# Patient Record
Sex: Female | Born: 1950 | Race: White | Hispanic: No | Marital: Single | Smoking: Never smoker
Health system: Southern US, Community
[De-identification: ages and names within clinical notes are randomized; demographics above are authoritative.]

## PROBLEM LIST (undated history)

## (undated) DIAGNOSIS — K5190 Ulcerative colitis, unspecified, without complications: Secondary | ICD-10-CM

## (undated) DIAGNOSIS — T4145XA Adverse effect of unspecified anesthetic, initial encounter: Secondary | ICD-10-CM

## (undated) DIAGNOSIS — F330 Major depressive disorder, recurrent, mild: Secondary | ICD-10-CM

## (undated) DIAGNOSIS — R569 Unspecified convulsions: Secondary | ICD-10-CM

## (undated) DIAGNOSIS — F419 Anxiety disorder, unspecified: Secondary | ICD-10-CM

## (undated) DIAGNOSIS — I10 Essential (primary) hypertension: Secondary | ICD-10-CM

## (undated) DIAGNOSIS — F32A Depression, unspecified: Secondary | ICD-10-CM

## (undated) DIAGNOSIS — T8859XA Other complications of anesthesia, initial encounter: Secondary | ICD-10-CM

## (undated) DIAGNOSIS — K219 Gastro-esophageal reflux disease without esophagitis: Secondary | ICD-10-CM

## (undated) DIAGNOSIS — M1990 Unspecified osteoarthritis, unspecified site: Secondary | ICD-10-CM

## (undated) DIAGNOSIS — F329 Major depressive disorder, single episode, unspecified: Secondary | ICD-10-CM

## (undated) HISTORY — PX: COLON SURGERY: SHX602

## (undated) HISTORY — PX: TONSILLECTOMY: SUR1361

## (undated) HISTORY — DX: Unspecified convulsions: R56.9

## (undated) HISTORY — PX: APPENDECTOMY: SHX54

## (undated) HISTORY — DX: Depression, unspecified: F32.A

## (undated) HISTORY — DX: Major depressive disorder, recurrent, mild: F33.0

## (undated) HISTORY — DX: Major depressive disorder, single episode, unspecified: F32.9

## (undated) HISTORY — DX: Anxiety disorder, unspecified: F41.9

## (undated) HISTORY — DX: Essential (primary) hypertension: I10

## (undated) HISTORY — DX: Ulcerative colitis, unspecified, without complications: K51.90

## (undated) HISTORY — PX: OTHER SURGICAL HISTORY: SHX169

---

## 1998-11-30 ENCOUNTER — Encounter: Payer: Self-pay | Admitting: Emergency Medicine

## 1998-11-30 ENCOUNTER — Emergency Department (HOSPITAL_COMMUNITY): Admission: EM | Admit: 1998-11-30 | Discharge: 1998-11-30 | Payer: Self-pay | Admitting: Emergency Medicine

## 1999-10-20 ENCOUNTER — Encounter (INDEPENDENT_AMBULATORY_CARE_PROVIDER_SITE_OTHER): Payer: Self-pay | Admitting: Specialist

## 1999-10-20 ENCOUNTER — Ambulatory Visit (HOSPITAL_COMMUNITY): Admission: RE | Admit: 1999-10-20 | Discharge: 1999-10-20 | Payer: Self-pay | Admitting: Gastroenterology

## 2000-01-08 ENCOUNTER — Emergency Department (HOSPITAL_COMMUNITY): Admission: EM | Admit: 2000-01-08 | Discharge: 2000-01-08 | Payer: Self-pay | Admitting: Emergency Medicine

## 2000-01-18 ENCOUNTER — Encounter: Payer: Self-pay | Admitting: Gastroenterology

## 2000-01-18 ENCOUNTER — Inpatient Hospital Stay (HOSPITAL_COMMUNITY): Admission: EM | Admit: 2000-01-18 | Discharge: 2000-01-25 | Payer: Self-pay | Admitting: Emergency Medicine

## 2000-01-18 ENCOUNTER — Encounter (INDEPENDENT_AMBULATORY_CARE_PROVIDER_SITE_OTHER): Payer: Self-pay | Admitting: *Deleted

## 2000-01-21 ENCOUNTER — Encounter: Payer: Self-pay | Admitting: Gastroenterology

## 2000-02-17 ENCOUNTER — Encounter: Payer: Self-pay | Admitting: Emergency Medicine

## 2000-02-17 ENCOUNTER — Emergency Department (HOSPITAL_COMMUNITY): Admission: EM | Admit: 2000-02-17 | Discharge: 2000-02-17 | Payer: Self-pay | Admitting: Emergency Medicine

## 2000-02-21 ENCOUNTER — Emergency Department (HOSPITAL_COMMUNITY): Admission: EM | Admit: 2000-02-21 | Discharge: 2000-02-21 | Payer: Self-pay | Admitting: Emergency Medicine

## 2000-06-08 ENCOUNTER — Ambulatory Visit (HOSPITAL_COMMUNITY): Admission: RE | Admit: 2000-06-08 | Discharge: 2000-06-08 | Payer: Self-pay | Admitting: Gastroenterology

## 2000-09-20 ENCOUNTER — Inpatient Hospital Stay (HOSPITAL_COMMUNITY): Admission: EM | Admit: 2000-09-20 | Discharge: 2000-09-22 | Payer: Self-pay | Admitting: Emergency Medicine

## 2000-09-20 ENCOUNTER — Encounter: Payer: Self-pay | Admitting: Emergency Medicine

## 2000-09-21 ENCOUNTER — Encounter: Payer: Self-pay | Admitting: Gastroenterology

## 2000-10-05 ENCOUNTER — Encounter: Payer: Self-pay | Admitting: Gastroenterology

## 2000-10-05 ENCOUNTER — Inpatient Hospital Stay (HOSPITAL_COMMUNITY): Admission: EM | Admit: 2000-10-05 | Discharge: 2000-10-09 | Payer: Self-pay | Admitting: Internal Medicine

## 2000-10-09 ENCOUNTER — Encounter: Payer: Self-pay | Admitting: Gastroenterology

## 2000-10-16 ENCOUNTER — Inpatient Hospital Stay (HOSPITAL_COMMUNITY): Admission: EM | Admit: 2000-10-16 | Discharge: 2000-10-18 | Payer: Self-pay | Admitting: Gastroenterology

## 2003-04-29 ENCOUNTER — Ambulatory Visit (HOSPITAL_COMMUNITY): Admission: RE | Admit: 2003-04-29 | Discharge: 2003-04-29 | Payer: Self-pay | Admitting: Gastroenterology

## 2003-04-29 ENCOUNTER — Encounter (INDEPENDENT_AMBULATORY_CARE_PROVIDER_SITE_OTHER): Payer: Self-pay | Admitting: Specialist

## 2005-04-15 ENCOUNTER — Ambulatory Visit (HOSPITAL_COMMUNITY): Admission: RE | Admit: 2005-04-15 | Discharge: 2005-04-16 | Payer: Self-pay | Admitting: Orthopedic Surgery

## 2006-01-06 ENCOUNTER — Ambulatory Visit: Payer: Self-pay | Admitting: Cardiovascular Disease

## 2006-01-06 ENCOUNTER — Inpatient Hospital Stay (HOSPITAL_COMMUNITY): Admission: EM | Admit: 2006-01-06 | Discharge: 2006-01-10 | Payer: Self-pay | Admitting: Emergency Medicine

## 2006-01-09 ENCOUNTER — Encounter: Payer: Self-pay | Admitting: Cardiology

## 2007-04-04 ENCOUNTER — Other Ambulatory Visit: Admission: RE | Admit: 2007-04-04 | Discharge: 2007-04-04 | Payer: Self-pay | Admitting: Obstetrics and Gynecology

## 2011-03-04 NOTE — H&P (Signed)
[facility]  Patient:    Terri [name], Terri [name]                        MRN: [id number] Adm. Date:  [id number] Attending:  Nelda Marseille CC:         Doreatha Lew, M.D.   History and Physical  HISTORY:  Patient, well-known to me with very-difficult-to-control colitis, one of multiple recent admissions, who has just not been doing well since her recent discharge on the 7th, with increasing diarrhea and lower abdominal pain; she has also had periodic nausea and vomiting.  She describes the pain as lower with mostly cramps.  There are some rectal spasms.  She still continues to see a minimal amount of blood in her bowels.  She has had no urinary complaints but has been scared to eat and has continued to lose weight due to not wanting to have her diarrhea.  She has been too weak to get out of the bed and has missed lots of work.  PAST MEDICAL HISTORY:  Her past medical history is pertinent for the colitis, as mentioned above, diagnosed just in the last year.  She has a history of depression in the 1980s and 1990s and the only surgeries are tonsillectomy and leg surgery.  ALLERGIES:  No known allergies.  CURRENT MEDICATIONS:  Asacol, prednisone, 6-MP, folic acid and Wellbutrin as well as iron; we have also had her on Nexium to see if that will help her ______ .  SOCIAL HISTORY:  Social history is pertinent for no street ______ , smoking or drinking and very minimal over-the-counter medicine use.  REVIEW OF SYSTEMS:  She has had some lower back pain, which is new, but no urinary complaints or bleeding and no other skin, eye or joint complaints.  PHYSICAL EXAMINATION  GENERAL:  Patient looks better than I thought, in no acute distress, lying comfortably in bed.  VITAL SIGNS:  Blood pressure 132/65, pulse 119, respirations 22, temperature 98.1.  HEENT:  Sclerae nonicteric.  NECK:  Supple without obvious adenopathy.  LUNGS:  Clear.  HEART:   Regular rate and rhythm.  ABDOMEN:  Soft.  No guarding or rebound.  RECTAL:  Exam not done.  BACK:  She does have some mild back pain on palpation.  No spinal process tenderness.  EXTREMITIES:  Good peripheral pulses.  No pedal edema.  LABORATORY AND X-RAY FINDINGS:  Labs and x-rays were reviewed, pertinent for a white count of 3.4 with a hemoglobin of 8.3, hematocrit 25.2 and platelet count 382,000.  Chemistries surprisingly normal except for an albumin of 3.3.  X-rays without obvious acute abnormality.  ASSESSMENT 1. Colitis flare. 2. Anemia secondary to gastrointestinal blood loss.  PLAN:  Bowel rest.  IV steroids.  I have discussed with her and her mom a university consult versus surgical options and we will try to set her up as soon as we can as an outpatient with Dr. Rona Ravens.  We have discussed transfusion, although her hematocrit is still just a touch over 25; I bet with increased hydration, she would drop below that level, so I felt it was in her best interest to go ahead and transfuse her now, which she agrees with.  She might need repeat colonoscopy to reevaluate her colon, versus barium enema to evaluate the stricture in the proximal transverse.  Possibly, we will put her on a short course of TPN for bowel rest or consider stopping the 6-MP  and the Asacol and just going with prednisone, to see if they are playing a role with her diarrhea. DD:  10/05/00 TD:  10/06/00 Job: [id number]

## 2011-03-04 NOTE — Op Note (Signed)
NAME:  Terri [name], Terri [name]                           ACCOUNT NO.:  [id number]   MEDICAL RECORD NO.:  [id number]                   PATIENT TYPE:  AMB   LOCATION:  ENDO                                 FACILITY:  [facility]   PHYSICIAN:  Petra Kuba, M.D.                 DATE OF BIRTH:  Mar 11, 1951   DATE OF PROCEDURE:  04/29/2003  DATE OF DISCHARGE:                                 OPERATIVE REPORT   PROCEDURE:  Flexible sigmoidoscopy with biopsy.   INDICATIONS:  Diarrhea, history of ulcerative colitis, status post surgery.  Consent was signed after risks, benefits, methods, and options were  thoroughly discussed in the office on multiple occasions.   PREMEDICATIONS:  Demerol 50 mg, Versed 4 mg.   DESCRIPTION OF PROCEDURE:  Rectal inspection pertinent for small external  hemorrhoids.  Digital exam is pertinent for what seemed to be a hemorrhoidal  or anal stricture but no mass was felt.  Pediatric video adjustable  colonoscope was inserted.  Anorectal pull-through confirmed some small  hemorrhoids.  Did not reveal any obvious mass, polyp or obvious stricturing  lesion in any rectal tissue present.  There was no obvious proctitis but  there seemed to be small bowel mucosa which was normal almost as soon as we  were past the anorectal junction.  Shortly above that area, about 10 cm, was  a double-barreled anastomosis implying a small bowel side to rectal end  anastomosis.  One limb was very shortened and blind with no abnormalities of  that limb.  The other limb we were able to advance to about 30-35 cm but  there was tortuosity and looping and we elected not to push any harder.  Scattered random biopsies of both this area and the distal anastomosis were  obtained.  We elected not to retroflex when no abnormalities were seen. The  scope was removed.  Air was suctioned.  The patient tolerated the procedure  well.  There was no obvious immediate complication.   ENDOSCOPIC DIAGNOSES:  1. Small  internal and external hemorrhoids with small palpable possible     stricture but not seen with the scope.  2. Ileoanal anastomosis with an ileo side anastomosis.  3. Otherwise exam normal to the short limb end as well as to about 35 cm     felt secondary to tortuosity and looping, status post random biopsies     throughout.   PLAN:  1. Await pathology.  2. Consider Carafate or Questran.  3.     Consider a small bowel follow-through to rule out other etiology.  4. Will touch base with her when we review the biopsies and decide how to     proceed.  5. We asked her to follow up in one month to review how medicine options are     going and have her call me p.r.n.  Petra Kuba, M.D.    MEM/MEDQ  D:  04/29/2003  T:  04/29/2003  Job:  [id number]   cc:   Duwayne Heck L. Mahaffey, M.D.  [address]  [place]  [place] [address]  Fax: [phone]

## 2011-03-04 NOTE — Discharge Summary (Signed)
[facility]  Patient:    Terri [name], Terri [name]                        MRN: [id number] Adm. Date:  [id number] Disc. Date: [id number] Attending:  Nelda Marseille CC:         Doreatha Lew, M.D.   Discharge Summary  HISTORY:  One of many admissions for this patient with inflammatory bowel disease not responding to the usual medicines who has been doing poorly at home with increased diarrhea, weakness, fatigue since her last admission.  She came in today for a flexible sigmoidoscopy and colonoscopy to reevaluate her disease.  HOSPITAL COURSE: She was again found to be significantly anemic requiring transfusion.  Her flexible sigmoidoscopy was down to 40 cm showing significant pyelitis throughout and she was admitted for a bowel rest, IV steroids, and transfusion.  She had an appointment at the [facility] clinic the end of January but we were able to get her in on the 2nd urgently to be evaluated. She did well in the 48-hour hospitalization with bowel rest and after discussion with her and her mother, she was discharged and her mother was to drive her to [facility] for further work-up and plans.  DISCHARGE DIAGNOSES: 1. Inflammatory bowel disease with flare. 2. Symptomatic anemia secondary to gastrointestinal bleed secondary to #1.  CONDITION ON DISCHARGE:  Stable.  DISCHARGE INSTRUCTIONS:  Go to [facility] to their clinic to be admitted with further workup plans, possible experimental protocol cyclosporine, etc., versus possible even surgical consultation. DD:  10/24/00 TD:  10/24/00 Job: [id number]

## 2011-03-04 NOTE — Discharge Summary (Signed)
[facility]  Patient:    Terri [name], Terri [name]                        MRN: [id number] Adm. Date:  [id number] Disc. Date: [id number] Attending:  Devoria Albe CC:         Petra Kuba, M.D.             Doreatha Lew, M.D.                           Discharge Summary  HOSPITAL COURSE:  Patient was admitted on April 3 with inflammatory bowel disease not responding to the usual medicines, with increased nausea, vomiting, dehydration, abdominal pain, and also with increasing anemia.  We went ahead and transfused her and put her on high-dose IV steroids.  There was a fair amount of depression and psychiatric overlay, and Dr. Adonis Housekeeper assisted with seeing the patient and did start her on an antidepressant which she believed did help.  We used Wellbutrin 150 b.i.d.  She continued to have symptoms despite the high-dose steroids, and we went ahead and even increased them and proceeded with a repeat colonoscopy on April 5 which did document significant disease.  There was no obvious problem but we went ahead based on its severity and started some TPN since she was still not able to advance her diet.  However, over the weekend, on TPN and on an increased dose of Solu-Medrol, she seemed to be doing better and was deemed ready for outpatient management on April 10.  CONDITION ON DISCHARGE:  Improved.  DISCHARGE DIAGNOSES: 1. Inflammatory bowel disease. 2. Depression. 3. Anemia.  DISCHARGE MEDICATIONS: 1. Prednisone 40 mg two times a day. 2. Tylenol okay and only over-the-counter. 3. Ultram one to two every 6 to 8 as needed. 4. Okay to use Librax for cramps, sleeping at night. 5. Okay to try the Rowasa enemas she has at home to see if they are helpful.  ACTIVITIES:  Very slowly increase as tolerated.  No work for one week.  DIET:  Low residue.  We did have the dietician go over that with her.  No fried, spicy, nuts, or popcorn.  WOUND CARE:  Not  applicable.  SPECIAL INSTRUCTIONS:  Call sooner if increased pain, bleeding, fever, weakness, nausea, vomiting, or other questions or problems.  FOLLOW-UP:  Follow-up will be on Wednesday, April 18, call sooner if needed to be seen.  ADDENDUM:  She, by the way, elected not to start her Wellbutrin just to see how she would do without it but will have a low threshold for restarting it in the future. DD:  02/17/00 TD:  02/19/00 Job: [id number]

## 2011-03-04 NOTE — Discharge Summary (Signed)
[facility]  Patient:    Terri [name], Terri [name]                        MRN: [id number] Adm. Date:  [id number] Disc. Date: [id number] Attending:  Nelda Marseille                           Discharge Summary  HISTORY:  The patient was admitted with colitis out of control and a flare with anemia and blood loss and increased fatigue.  Was put on bowel rest and IV steroids.  We tried to get her a consultation with Dr. Cedric Fishman of [facility], but with the holidays, they were too booked up unless patient needed inpatient transfer, and we deemed her condition too stable for a holiday transfer but would have a low threshold for transferring her in the future. We did go ahead and transfuse her for a hemoglobin of 8, knowing that with hydration it would drop even further, so this helped her symptoms.  She did well with bowel rest with less pain, diarrhea, less cough, and her pain decreased.  The transfusion definitely helped.  On the 22nd, we were able to slowly advance her diet, and unfortunately her diarrhea seemed to pick up on the 23rd, so she was kept one more day, and I saw her on the 24th.  All of her labs were stable, and she was doing much better, and we elected to proceed with an upper GI small-bowel follow-through just to make sure we were not missing anything.  Because of the holiday, although she was not perfect, she was much improved.  We did decide that she would be able to go home for the holidays after the x-ray if it was okay, which it was, but we would probably check a colonoscopy in one week, and she and her mother were both aware that although she was not cured, she might need to be readmitted in the near future.  It was probably best to try outpatient management one more time.  CONDITION ON DISCHARGE:  Improved.  DISCHARGE MEDICATIONS:  Continue prednisone, iron, 6-MP, Wellbutrin, and Librax.  We will go ahead and stop the Asacol to make sure it is  not playing a role with her diarrhea.  If no better on Friday, she was instructed to stop the Wellbutrin again to make sure it was not playing a role.  ACTIVITY:  Slowly advance as tolerated.  DIET:  Continued low residue.  WOUND CARE:  Not applicable.  SPECIAL INSTRUCTIONS:  Call sooner if questions, problems, increased diarrhea, fatigue, bleeding, pain, etc., and she will call me Thursday with an update and if no better and diarrhea continuing, we will go ahead and schedule her for the outpatient colonoscopy on Monday.  FOLLOW-UP:  As above.  I will leave her out of work until after the colonoscopy which would be October 18, 2000. DD:  10/28/00 TD:  10/29/00 Job: [id number]

## 2011-03-04 NOTE — Op Note (Signed)
NAME:  Terri [name], Terri [name]                 ACCOUNT NO.:  [id number]   MEDICAL RECORD NO.:  [id number]          PATIENT TYPE:  OIB   LOCATION:  5036                         FACILITY:  [facility]   PHYSICIAN:  Doralee Albino. Carola Frost, M.D. DATE OF BIRTH:  1951-04-03   DATE OF PROCEDURE:  04/15/2005  DATE OF DISCHARGE:                                 OPERATIVE REPORT   PREOPERATIVE DIAGNOSIS:  Symptomatic hardware, right tibia.   POSTOPERATIVE DIAGNOSIS:  Symptomatic hardware, right tibia.   PROCEDURE:  Removal of implant deep right tibia.   SURGEON:  Doralee Albino. Carola Frost, M.D.   ASSISTANT:  Cecil Cranker, P.A.-C.   ANESTHESIA:  General.   COMPLICATIONS:  None.   SPECIMENS:  Two, anaerobic and aerobic cultures sent from the intramedullary  canal following their removal.   DISPOSITION:  To the PACU.   CONDITION:  Stable.   INDICATIONS FOR PROCEDURE:  Terri [name] is a 60 year old female who was  complaining of anterior tibial discomfort and tenderness over her locking  bolt sites proximally and distally over the last several months which had  failed to resolve with nonoperative management and a steroid injection  around the proximal knee by one of my partners.  After discussing the risks  and benefits of surgery, she wished to proceed.  She understood the risks of  failure of this removal to improve her symptoms.  She understood that if  there was any evidence of infection, she would need to undergo reaming of  her canal and also the possibility of fracture, blood loss, and infection.   DESCRIPTION OF PROCEDURE:  Because there was no suspicion of infection based  on her clinical examination and negative laboratory studies, Terri [name] was  administered preoperative antibiotics and then taken to the operating room  where general anesthesia was induced and she was placed supine on the  fracture table.  A standard prep and drape was performed of the right lower  extremity.  The old locking bolt  incisions were remade and the locking bolts  backed out in their entirety except for one of the distal locks which was  left partially in the nail to prevent rotation during capture at the  proximal end of the nail, itself.  The locking bolts were noted to be very  loose on removal.  The large incision over the anterior knee was then  partially reopened and dissection was carried down to the paratenon which  was split longitudinally for repair following the procedure.  We then  continued dissection distally where the tibial nail was noted to have  actually been placed not through the knee but medial to the tubercle and the  proximal 1.5 cm were actually protruded into the bone.  There was cortex on  the posterior side of the nail along its medial margin and it had partially  grown over and was anterior on its lateral margin adjacent to the tubercle.  The end was debrided of soft tissue with curet and rongeur and then captured  with the universal removing bolt from [employer] as this was a [employer] nail.  The locking bolt was then removed in its entirety and the nail  removed without complications.  There was no significant fluid collection  encountered either at the locking bolt sites or at the apex of the nail.  There was no purulence of any kind.  The canal was then swabbed with aerobic  and anaerobic cultures which were sent to pathology.  No reaming was  performed because there was no evidence of infection.  The wounds were  copiously irrigated and then closed in standard layered fashion with 0  Vicryl for the paratenon and deep layer, 2-0 for the subcu, and staples for  the skin.  Sterile, gently compressive dressing was applied.  The patient  was awakened from anesthesia and transported to the PACU in stable  condition.   PROGNOSIS:  Terri [name] will be weight-bearing as tolerated with crutches for  assistance and I am hopeful that she will be able to regain full ambulatory   function over the next month or so.  She has a good chance of resolving her  symptoms, or at least dramatically improving them, given that this nail  appeared to have been inserted through the medial tubercle area and that it  was prominent, it was loose, and it was stainless steel.  We will follow up  her cultures and if she develops any signs or evidence of infection, then we  will alter her treatment accordingly.  She will be on Lovenox while in the  hospital for DVT prophylaxis and then will take a baby aspirin daily until  she has regained full mobility.       MHH/MEDQ  D:  04/15/2005  T:  04/15/2005  Job:  [id number]

## 2011-03-04 NOTE — H&P (Signed)
[facility]  Patient:    Terri [name], Terri [name]                        MRN: [id number] Proc. Date: 01/18/00 Adm. Date:  [id number] Disc. Date: [id number] Attending:  Otilio Connors Iv CC:         Doreatha Lew, M.D.                         History and Physical  INDICATIONS:  The patient with recently diagnosed inflammatory bowel disease, not responding to outpatient management with frequent bloody diarrhea.  This weekend began having increased nausea and vomiting and lower abdominal pain.  She says he moves her bowels everytime she moves.  She has not been urinating much at all, going rarely once a week.  She has had multiple crying spells.  She had been to the emergency room once already and has had close office follow-up, but says she has been taking her medicine properly, but has not responded and is being admitted or further workup, plans, and IV medicines, and bowel rest.  She has had no sick contacts and has no other specific complaints.  PAST MEDICAL HISTORY:  Pertinent for leg surgery and tonsillectomy, but no chronic medical problems.  She is going through menopause.  MEDICATIONS: 1. Asacol. 2. Prednisone. 3. She has been on Librax which has not been helpful.  ALLERGIES:  No known drug allergies.  SOCIAL HISTORY:  She does not smoke or drink.  She does not use any aspirin or nonsteroidals.  FAMILY HISTORY:  Negative for an Crohns or colitis in the family.  PHYSICAL EXAMINATION:  VITAL SIGNS:  Temperature 99.9, pulse 130, sclerae nonicteric.  NECK:  Supple without adenopathy.  LUNGS:  Clear.  HEART:  Regular rate and rhythm.  ABDOMEN:  Soft, essentially nontender.  No guarding or rebound.  EXTREMITIES:  She is crying.  No pedal edema, good peripheral pulses.  LABORATORY DATA:  Pending at the time of dictation.  X-rays pertinent for some decreased haustral markings in the colon.  ASSESSMENT: 1. Inflammatory bowel  disease. 2. Nausea and vomiting and dehydration and abdominal pain. 3. Anemia.  PLAN:  Await labs, IV fluids, bowel rest, will transfuse p.r.n.  She does have  problem with that.  Will use IV steroids.  Follow clinical course and decide any further workup and plans.  Might need to try Remicaid, possibly may even need an antidepressant or psychological help.  May even need Doreatha Lew, M.D. to  visit to assist in that department. DD:  01/18/00 TD:  01/18/00 Job: [id number]

## 2011-03-04 NOTE — Discharge Summary (Signed)
Terri [name], [name]                 ACCOUNT NO.:  [id number]   MEDICAL RECORD NO.:  [id number]          PATIENT TYPE:  INP   LOCATION:  4731                         FACILITY:  [facility]   PHYSICIAN:  Kela Millin, M.D.DATE OF BIRTH:  [birth year]   DATE OF ADMISSION:  01/06/2006  DATE OF DISCHARGE:  01/10/2006                                 DISCHARGE SUMMARY   DISCHARGE DIAGNOSES:  1. Chest pain -- noncardiac per Cardiology, likely secondary to      gastroesophageal reflux disease.  2. Presyncope -- unclear etiology, patient to follow up with [facility]      Cardiology for loop monitor.  3. History of ulcerative colitis -- status post colectomy.   PROCEDURES AND STUDIES:  1. Stress Myoview -- per [facility], negative for ischemia.  2. Two-dimensional echocardiogram -- overall left ventricular systolic      function normal, ejection fraction 60% to 65%, no diagnostic evidence      of left ventricular regional wall motion abnormality.   CONSULTATIONS:  [facility].   HISTORY:  The patient is a 60 year old female who presented with complaints  of chest pain.  She stated that she was awakened from sleep by severe  midsternal chest discomfort.  She had been referred to Dr. Ewing Schlein about this  problem and given samples of medication that the patient could not remember  the name of and she was instructed to take 1 tablet daily, but the patient  had not been taking it consistently.  She reported that the medication had  been very helpful and she thought it probably was for acid reflux.  She  described the chest pain as midsternal, not associated with shortness of  breath nor diaphoresis.  She started that at about 1:30 p.m. on the day of  admission, she got out of her car to go back to work and said she felt as if  things were closing in on her and if she were going to pass out.  She stated  that she also felt like her heart was beating fast.  When she came to the  door where  she works, her coworkers recognized that she was not feeling well  and sat in a chair.  She did not lose consciousness.  She did not experience  any chest pain at the time.  EMS was called and her blood pressure was  checked at that time and was 150/98 with a pulse of 96 and the patient was  transported to the [facility] ER.  On arrival in the ER, her blood pressure was  143/79, O2 SAT of 96%.  She was given a sublingual nitroglycerin, which gave  her a headache, but really did not seem to have any other effect.  She had a  chest x-ray done which was negative and her point-of-care markers were  negative.  It was noted that her father had died at age 7 of a myocardial  infarction.  She was admitted to the [facility] for further  evaluation and management.   PHYSICAL EXAMINATION:  Her physical exam upon admission  as per Dr. Tresa Endo  revealed a temperature of 97.8, blood pressure 140/79, pulse of 86,  respiratory rate of 20, O2 SAT of 96% and the rest of her physical exam was  noted to be within normal limits.   LABORATORY DATA:  Her cardiac enzymes were negative.  Her sodium was 138,  potassium 3.7, chloride 108, glucose 86, BUN 8.  Her white cell count 6.6,  hemoglobin 16, hematocrit of 47, platelet count 178,000.  Her hemoglobin A1c  5.1 and total cholesterol 169.   HOSPITAL COURSE:  PROBLEM #1 - CHEST PAIN/PRESYNCOPE:  Upon admission, the  patient had serial cardiac enzymes done and those were negative for MI.  Cardiology was consulted and [facility] saw the patient and a stress test was  done and it was negative for ischemia.  A 2-D echo was also done and it as  within normal limits, as noted above.  The impression was that this chest  pain was likely secondary to GERD and the patient was placed on a PPI.  She  was discharged home on Prilosec and to follow up with Dr. Ewing Schlein,  gastroenterologist.  The patient was seen by Cardiology on rounds and agreed  with discharging the  patient.  She was to follow up with [facility]  for a loop monitor for further evaluation of the presyncopal episode.   DISCHARGE MEDICATIONS:  Prilosec.   FURTHER CARE:  1. Dr. Ewing Schlein, as scheduled.  2. [facility], as scheduled.      Kela Millin, M.D.  Electronically Signed     ACV/MEDQ  D:  05/04/2006  T:  05/04/2006  Job:  [id number]   cc:   Leanne Chang, M.D.

## 2011-03-04 NOTE — H&P (Signed)
[facility]  Patient:    Terri [name], Terri [name]                          MRN: [id number] Adm. Date:  09/20/00 Attending:  Verlin Grills, M.D. CC:         Petra Kuba, M.D.   History and Physical  PROBLEM:  Ulcerative colitis and anemia.  HISTORY:  The patient is a 60 year old female.  She was diagnosed with ulcerative colitis by colonoscopy performed by Dr. Petra Kuba in November 2000.  Despite prednisone, 6-mercaptopurine and Asacol, the patient continues to have abdominal cramps, bloody diarrhea and intermittent night sweats.  She has received intravenous Remicade in the past.  She has required blood transfusions in the past few months.  She has lost approximately 50 pounds since her ulcerative colitis diagnosis in November of 2000.  MEDICATION ALLERGIES:  None.  CHRONIC MEDICATIONS:  Prednisone, 6-mercaptopurine, folic acid, Wellbutrin, iron and Asacol.  PAST MEDICAL HISTORY:  Surgery for right leg fracture, tonsillectomy, chronic ulcerative colitis.  HABITS:  Ms. Holts does not smoke cigarettes or consume alcohol.  PHYSICAL EXAMINATION  VITAL SIGNS:  Blood pressure 145/86.  Temperature 98.4.  GENERAL APPEARANCE:  Ms. Popoca is comfortable, lying on the hospital bed.  HEENT:  Sclerae nonicteric.  Palpebral conjunctivae pale.  Oropharynx normal.  CARDIAC:  Regular rhythm without murmurs.  LUNGS:  Clear to auscultation.  ABDOMEN:  Soft, flat and nontender.  EXTREMITIES:  No edema.  ASSESSMENT:  Chronic ulcerative colitis and anemia. DD:  09/20/00 TD:  09/21/00 Job: [id number]

## 2011-03-04 NOTE — Discharge Summary (Signed)
[facility]  Patient:    Terri [name], Terri [name]                        MRN: [id number] Adm. Date:  [id number] Disc. Date: [id number] Attending:  Dennison Bulla Ii CC:         Dr. Adonis Housekeeper   Discharge Summary  HISTORY:  Patient was admitted by my partner, Reece Agar, who was on call for me, when she presented to the emergency room with multiple complaints all thought secondary to symptomatic anemia.  She was found to have a hemoglobin drop from 8.4 down to 6 and improved significantly with two units of blood. She had been having a flare of colitis and had not responded to oral prednisone and we had planned outpatient Remicade for Friday but her increased blood loss caused her to need to be admitted.  She was put on a low residue diet and given IV Solu-Medrol 40 mg q.12 and her symptoms seemed to improve significantly with decreased diarrhea and bleeding.  We went ahead and did a chest x-ray and an abdominal CT scan to rule out other etiologies since she had lost a significant amount of weight since I have known her during her therapy and during her symptoms and problems with her colitis.  Both of those were nonrevealing.  A PPD in my office which was placed prior to the Remicade was read and was nonreactive and we elected to proceed with Remicade today on the 7th.  Her hemoglobin actually went up from 8.8 to 9.3 post transfusion today.  Other labs were fine and her Remicade was given, well tolerated, and she had no problems eating dinner tonight and was deemed ready for discharge and outpatient management.  DISCHARGE DIAGNOSES: 1. Inflammatory bowel disease, probable ulcerative colitis. 2. Symptomatic anemia. 3. Gastrointestinal blood loss secondary to #1.  MEDICATIONS:  Resume home 6-MP which is 25 a day, Asacol, and iron.  We will give her prednisone 30 mg tonight and try 40 every a.m. beginning tomorrow. If she is not doing well, she is instructed to  take an additional 20 mg at night.  She will pick up samples of Nexium in the office when she comes for lab work for reflux and to see if that will help her chronic cough. In the meantime, we will look up 6-MP and Asacol to see if possibly they are playing a role with her chronic cough since antibiotics and chest x-rays have been not been revealing or helpful.  ACTIVITIES:  Okay to work on Monday.  If not, she will call my office and get a note and we will repeat labs then.  If not, she will talk to Jodie on Monday, update her symptoms, and repeat labs probably Wednesday, and discuss followup with me over the next week or two.  SPECIAL INSTRUCTIONS:  Call sooner if increased pain, diarrhea, nausea, vomiting, bleeding, weakness, fatigue, etc.  Wound care:  Not applicable.  DIET:  No nuts, seeds, popcorn, fried, or spicy.  CONDITION ON DISCHARGE:  Significantly improved. DD:  09/22/00 TD:  09/23/00 Job: [id number]

## 2011-03-04 NOTE — Procedures (Signed)
[place]  Patient:    Terri [name], Terri [name]                        MRN: [id number] Proc. Date: 06/08/00 Adm. Date:  [id number] Disc. Date: [id number] Attending:  Nelda Marseille CC:         Doreatha Lew, M.D.                           Procedure Report  PROCEDURES:  Colonoscopy with biopsy.  INDICATIONS:  A patient with colitis.  Want to reevaluate endoscopic appearance.  Consent was signed after the risks, benefits, methods, and options were thoroughly discussed multiple times in the past.  MEDICINES USED:  Demerol 75 mg and Versed 7 mg.  DESCRIPTION OF PROCEDURE:  Rectal inspection was pertinent for small external hemorrhoids.  The digital exam was negative.  The video pediatric colonoscope was inserted and easily advanced around the colon to the probable proximal transverse where a smooth benign-appearing stricture was seen.  We could not advanced the scope through the stricture due to its small diameter, although we thought we could see normal colon in the distance.  On insertion, she had obvious proctitis to about 30-35 cm.  The sigmoid was a little less inflamed and then she had a somewhat normal-appearing segment of descending followed by an area of descending and transverse with some scarring from previous colitis and multiple pseudopolyps.  She then had an area of transverse with some mild patchy inflammation and then came the stricture.  Since we were unable to advance through this strictured area and we had not discussed things like balloon dilatation with the patient, we elected to withdrawal.  Scattered biopsies of the transverse and descending were obtained of both the pseudopolyps, normal-appearing areas, and the mild inflammation and put in the first container.  As the scope was withdrawn around the sigmoid and the rectum, scattered biopsies were obtained and put in the second container. Based on the degree of inflammation, we  elected not to retroflex in the rectum.  No other abnormalities were seen.  The scope was removed and the upper video endoscope was inserted and advanced to the level of the stricture, but again despite abdominal pressure we were unable to advance the upper endoscope through the strictured area, although had a slightly better visualization of the normal mucosa passes.  The upper endoscope was removed fairly quickly.  No additional findings were seen.  The patient tolerated the procedure well.  There was no obvious immediate complication.  ENDOSCOPIC DIAGNOSES: 1. Small hemorrhoids. 2. Moderate proctitis and minimal sigmoiditis to about 30-35 cm, status post    biopsy and put in container #2. 3. Pseudopolyps and healed colitis in the descending and part of the    transverse, status post biopsy and put in container #1. 4. Mild transverse patchy inflammation, status post biopsy and put in    container #1. 5. Probable proximal transverse smooth stricture.  Unable to advance either    the pediatric colonoscope or the upper endoscope. DD:  06/08/00 TD:  06/10/00 Job: [id number]

## 2011-03-04 NOTE — H&P (Signed)
[facility]  Patient:    Terri [name], Terri [name]                        MRN: [id number] Adm. Date:  [id number] Attending:  Nelda Marseille CC:         Doreatha Lew, M.D.   History and Physical  HISTORY:  The patient with one of many recent admissions with very difficult to control colitis not responding to the usual medicines, was recently discharged just before Christmas and has not done well at home, was scheduled for outpatient colonoscopy.  Recent work-up to rule out other etiologies to include a nondiagnostic CT scan and upper GI small-bowel follow-through.  Her colitis has looked like ulcerative colitis as well as biopsies have implied that.  She did have a colon stricture on the last colonoscopy in the proximal level of the hepatic flexure, and we wanted to reevaluate that as well as her increased diarrhea and bright red blood per rectum.  She has had no energy and has required multiple transfusions over the last few months.  PAST MEDICAL HISTORY:  Pertinent for the colitis mentioned above and some depression.  Her only surgeries are tonsillectomy and leg surgery.  ALLERGIES:  No known allergies.  CURRENT MEDICATIONS:  Pertinent for stopping the Asacol, and she has not noticed any difference.  Prednisone 60 a day, 6-MP 25 a day based on her levels in the office, folic acid 1 a day, and Wellbutrin as well as iron.  We have also had her on Nexium to see if that helped her chronic cough which really has been unsuccessful.  SOCIAL HISTORY:  Pertinent for no drugs, smoking, or drinking, with very minimal over-the-counter medicine use.  She has had some help from suppositories with her colitis.  REVIEW OF SYSTEMS:  Pertinent for some low-back pain but no other complaints at home other than the increased diarrhea and decreased energy.  Her cough has been about the same with multiple normal chest x-rays.  PHYSICAL EXAMINATION:  GENERAL:  In tears,  quite frustrated, but no significant pain distress.  VITAL SIGNS:  Stable, afebrile.  HEENT:  Sclerae nonicteric.  NECK:  Supple without obvious adenopathy.  LUNGS:  Clear.  HEART:  Regular rate and rhythm.  ABDOMEN:  Soft, nontender.  EXTREMITIES:  No pedal edema.  LABORATORY:  Pending at the time of this dictation.  ASSESSMENT: 1. Colitis flare not responding to the usual medicines. 2. Anemia secondary to gastrointestinal bleed. 3. History of depression. 4. Chronic cough, questionable etiology.  PLAN:  Will admit for bowel rest and IV steroids which has always helped.  I have put a call in to [facility] regarding transfer.  She did have an initial consult at the end of January which is the earliest that they could get her in, but in the past when we have called, they said they would accept her in transfer to evaluate sooner, and hopefully we can discuss that.  The other option would be surgical consult here, which she believes she is leaning to. Dr. Madilyn Fireman will manage her case the rest of this week in my absence. DD:  10/16/00 TD:  10/16/00 Job: [id number]

## 2011-03-04 NOTE — Procedures (Signed)
[facility]  Patient:    Terri [name], Terri [name]                        MRN: [id number] Proc. Date: 10/16/00 Adm. Date:  [id number] Attending:  Nelda Marseille CC:         Doreatha Lew, M.D.   Procedure Report  PROCEDURE:  Flexible sigmoidoscopy.  INDICATIONS FOR PROCEDURE:  Persistent diarrhea and bleeding, history of inflammatory bowel disease not responding to the usual medicines, want to reevaluate.  Consent was signed after risks, benefits, methods, and options were thoroughly discussed multiple times in the past.  MEDICINES USED:  Demerol 60, Versed 9.  DESCRIPTION OF PROCEDURE:  Rectal inspection is pertinent for being normal. Digital exam was negative. The digital pediatric colonoscope was inserted and easily advanced to 40 cm. Unfortunately, there was some narrowing, some edema, significant ulceration and we began causing some discomfort and we elected to withdraw. Beginning at the rectum, there was some severe ulcerative colitis with even pseudopolyps seen. No biopsies were obtained. The scope was slowly withdrawn, air was suctioned. No additional findings were seen. The patient tolerated the procedure well. There was on obvious or immediate complications.  ENDOSCOPIC DIAGNOSIS:  Severe colitis and pseudopolyps to 40 cm, stop secondary to pain and possibly narrowing partial obstruction in the distance.  PLAN:  Bowel rest, clear liquids only, IV steroids, will admit and transfuse and we have arranged transfer to the inflammatory bowel disease department at [facility] to decide any other workup plans or surgical options which [name] is in favor of.  Although she is leaning towards surgical options, we will see if [facility] and Dr. Erma Heritage have any other therapies to try. DD:  10/16/00 TD:  10/16/00 Job: [id number]
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
## 2011-03-04 NOTE — Consult Note (Signed)
Terri [name], SCHIAVI NO.:  [id number]   MEDICAL RECORD NO.:  [id number]          PATIENT TYPE:  INP   LOCATION:  4731                         FACILITY:  [facility]   PHYSICIAN:  Verne Grain, MD   DATE OF BIRTH:  12/09/1950   DATE OF CONSULTATION:  01/07/2006  DATE OF DISCHARGE:                                   CONSULTATION   PRIMARY GASTROENTEROLOGIST:  Petra Kuba, M.D.   PRIMARY CARE PHYSICIAN:  [facility], Danielle L. Mahaffey, M.D.   PRIMARY CARDIOLOGIST:  None.   CONSULTATIONS:  1.  Rollene Rotunda, M.D., [facility].  2.  Verne Grain, MD.  3.  [facility].   REASON FOR CONSULTATION:  Chest pain/left arm pain and presyncope.   HISTORY OF PRESENT ILLNESS:  A 60 year old female with a positive family  history of coronary artery disease (father with fatal myocardial infarction  at age 59 years old), history of refractory ulcerative colitis, history of  GI bleed, anemia, obesity, history of depression, who was walking to her car  and had sudden onset of lightheadedness, did not completely pass out but  felt like she was close.  The patient reports no precipitating factors prior  to this episode.  No preceding symptoms, no pain, no palpitations, no  abnormalities beyond the lightheadedness.  She was subsequently evaluated  with finding of systolic blood pressure of 150 that did not decrease upon  standing (per patient).  She had chest x-ray which was essentially negative  and EKG which weakness negative, CK-MB and troponin that were negative.  She  has had no recurrent symptoms.  She has been monitored on telemetry with no  significant abnormalities detected.  She has no personal history of coronary  artery disease.  She did, however, note some left upper arm and left side  pain that was somewhat positional and not well characterized.  There was no  radiation of this discomfort.  No nausea or vomiting, shortness of breath  or  diaphoresis accompanying this discomfort.  She has never had symptoms like  this with exertion.  She denies any previous history of chest pain or  dyspnea on exertion.  She has had a spiral CT that was ordered (prompted by  D-dimer of 0.85), the results of which are still pending.   PAST MEDICAL HISTORY:  1.  Positive family history of coronary artery disease (father with fatal      myocardial infarction at age 28 years old).  2.  Refractory ulcerative colitis with history of GI bleed and anemia.  3.  History of depression/anxiety.  4.  History of a right tibial implant.  5.  Obesity.   SOCIAL HISTORY:  The patient reports no tobacco, alcohol or illicit drug use  in her past.  She resides in [place], [place].   FAMILY HISTORY:  The patient's mother is alive and has a pacemaker.  Patient's father died at age 34 with a myocardial infarction.   ALLERGIES/ADVERSE REACTIONS:  NO KNOWN DRUG ALLERGIES.   CURRENT MEDICATIONS:  1.  Lovenox 40 mg  subcu nightly.  2.  Metoprolol 25 mg p.o. b.i.d.  3.  Protonix 40 mg p.o. daily.  4.  Aspirin 162 mg p.o. daily.  5.  PRNs   PHYSICAL EXAMINATION:  GENERAL APPEARANCE:  Patient is obese, pleasant,  cooperative, answers questions appropriately.  She is in no apparent  distress, resting comfortably in her bed.  VITAL SIGNS:  Temperature 98.8, heart rate 78, respiratory rate 20, blood  pressure 126/70, oxygen saturation 97% on room air, weight 223 pounds.  HEENT:  She is normocephalic and atraumatic.  Extraocular movements intact.  Oropharynx pink and moist without lesions.  NECK:  Without jugular venous distension, no carotid bruits are heard.  LUNGS:  Clear to auscultation bilaterally.  CARDIOVASCULAR:  Regular S1 and regular S2.  ABDOMEN:  Soft and nontender, nondistended with positive bowel sounds.  EXTREMITIES:  Lower extremity examination reveals no evidence of edema.  SKIN:  Limited skin examination is without acute rash.   NEUROLOGIC:  Limited neurologic exam was grossly nonfocal.  The patient  appears to move all four extremities without difficulty.  Gait was not  tested.   REVIEW OF SYSTEMS:  No recent fever or chills.  No cough, no claudication,  wheezing, no overt chest pain although had this left upper arm, left-sided  chest pain which was described in the HPI.  No bowel or bladder complaints.  No acute neuropsychiatric complaints.  No heat or cold intolerance.  No skin  or hair changes.  __________ abnormalities.  No acute alterations, no  auditory or visual acuity.   LABORATORY DATA:  CK, MB and troponin negative x3. White blood cell count  6.6 with 65% neutrophils, hematocrit 47, platelet count 178.  INR 0.9.  Total cholesterol 169, triglycerides 81, HDL 56, LDL 97.  Sodium 138,  potassium 3.7, chloride 108, bicarb 26, BUN 8, creatinine 0.8, glucose 86.  D-dimer 0.85.  Anion gap 3.   EKG:  Sinus rhythm with a normal axis, some delay in R-wave progression but  otherwise no appreciable abnormalities.  No Q-waves.  No changes, diagnostic  ischemia, no evidence of left ventricular hypertrophy.   Chest x-ray:  No acute findings.   Spiral CT of the chest:  Results pending.   ASSESSMENT/RECOMMENDATIONS:  A 60 year old female with positive family  history of coronary artery disease (father with fatal myocardial infarction  at age 50 years old), history of refractory ulcerative colitis with history  of gastrointestinal bleeding and anemia, obesity, history of depression,  admitted with presyncopal episode followed by left upper arm and left  lateral chest discomfort of unclear etiology and significance.  Patient  reports that she has had episodes of presyncope similar to this in the past,  although this one was somewhat unusual in that there did not appear to be  clear provoking factor.  Her LDL is 97.  As mentioned above, EKG and serial cardiac markers have been negative.  Telemetry is without any  significant  alarms.  No significant pauses, bradycardia or tachycardia.   1.  Continue aspirin, Lovenox and Protonix.  2.  Check a hemoglobin A1c and thyroid profile with morning labs.  3.  Follow up results of spiral CTA excluding pulmonary embolus.  4.  NPO except for medications, hold metoprolol while awaiting a treadmill      stress Cardiolite.  If unable to arrange Cardiolite in the morning and      no recurrent symptoms of chest pain, left arm pain, presyncope or other  complaint, could consider discharge of the patient with follow-up clinic      evaluation early next week with treadmill stress Cardiolite,      echocardiogram  and consideration of loop monitor for any recurrent      symptoms of lightheadedness/presyncope with Dr. Antoine Poche.           ______________________________  Verne Grain, MD     DDH/MEDQ  D:  01/08/2006  T:  01/10/2006  Job:  [id number]   cc:   Petra Kuba, M.D.  Fax: [phone]   Danielle L. Mahaffey, M.D.  Fax: [phone]

## 2011-03-04 NOTE — Procedures (Signed)
[place]  Patient:    Terri [name], Terri [name]                        MRN: [id number] Adm. Date:  [id number] Attending:  Nelda Marseille CC:         Petra Kuba, M.D.             Doreatha Lew, M.D.                           Procedure Report  PROCEDURE:  Colonoscopy.  INDICATIONS:  Inflammatory bowel disease, not responding to the usual medicines. Consent was signed after risks, benefits, methods and options were thoroughly discussed on multiple occasions with [name] including this morning.  ENDOSCOPIST:  Petra Kuba, M.D.  MEDICINES USED:  Demerol 85 mg, Versed 5 mg.  DESCRIPTION OF PROCEDURE:  Rectal inspection was pertinent for small external hemorrhoids.  Digital exam was negative.  The video pediatric colonoscope was inserted.  Her rectum and distal sigmoid were only very mildly inflamed; however, just above the distal sigmoid was significant colitis with ulcerations.  In the  descending and transverse, there were signs of black probable pseudopolyps, a few of which were biopsied.  We did advance around what we thought was the hepatic flexure, looking into the cecum, but because of poor prep on the right side, significant colitis and pseudopolyps, it was difficult to make out the usual landmarks to make sure.  Scattered biopsies on slow withdrawal were obtained. ome of the liquid stool was suctioned.  Other than the significant colitis throughout and the above-mentioned probable pseudopolyps, which I had Dr. [name]. [name] to take a look at while we were slowly withdrawing, no other abnormality was seen.  The scope not retroflexed in the rectum.  Air and water were suctioned and the scope removed.  Patient tolerated the procedure well.  There was no obvious immediate complications.  ENDOSCOPIC DIAGNOSES: 1. Minimal proctitis. 2. Significant colitis, otherwise, an exam to probably the right side of the colon,  difficult to say for sure, based on prepped exam and significant inflammation. 3. Probable pseudopolyps, although they were blacker, which could not be washed off    status post biopsy.  PLAN:  Await pathology.  Questionably will use Remicade and possibly [facility] a  [facility] or even put her on TPN for at-home bowel rest even longer and we will decide that in the a.m., based on symptoms. DD:  01/20/00 TD:  01/21/00 Job: [id number]

## 2014-07-25 ENCOUNTER — Ambulatory Visit (INDEPENDENT_AMBULATORY_CARE_PROVIDER_SITE_OTHER)
Admission: RE | Admit: 2014-07-25 | Discharge: 2014-07-25 | Disposition: A | Discharge status: Home / Self Care | Payer: PRIVATE HEALTH INSURANCE | Source: Ambulatory Visit | Attending: Internal Medicine | Admitting: Internal Medicine

## 2014-07-25 ENCOUNTER — Encounter: Payer: Self-pay | Admitting: Internal Medicine

## 2014-07-25 ENCOUNTER — Ambulatory Visit (INDEPENDENT_AMBULATORY_CARE_PROVIDER_SITE_OTHER): Payer: PRIVATE HEALTH INSURANCE | Admitting: Internal Medicine

## 2014-07-25 ENCOUNTER — Institutional Professional Consult (permissible substitution): Payer: Self-pay | Admitting: Internal Medicine

## 2014-07-25 VITALS — BP 122/80 | HR 91 | Temp 98.6°F | Ht 64.0 in | Wt 218.6 lb

## 2014-07-25 DIAGNOSIS — I10 Essential (primary) hypertension: Secondary | ICD-10-CM

## 2014-07-25 DIAGNOSIS — R058 Other specified cough: Secondary | ICD-10-CM

## 2014-07-25 DIAGNOSIS — R05 Cough: Secondary | ICD-10-CM

## 2014-07-25 MED ORDER — PREDNISONE 10 MG PO TABS: ORAL_TABLET | ORAL | Status: DC

## 2014-07-25 MED ORDER — TRAMADOL HCL 50 MG PO TABS: ORAL_TABLET | ORAL | Status: DC

## 2014-07-25 MED ORDER — PANTOPRAZOLE SODIUM 40 MG PO TBEC: 40.0000 mg | DELAYED_RELEASE_TABLET | Freq: Every day | ORAL | Status: DC

## 2014-07-25 MED ORDER — FAMOTIDINE 20 MG PO TABS: ORAL_TABLET | ORAL | Status: DC

## 2014-07-25 MED ORDER — OLMESARTAN MEDOXOMIL-HCTZ 20-12.5 MG PO TABS: 1.0000 | ORAL_TABLET | Freq: Every day | ORAL | Status: DC

## 2014-07-25 NOTE — Patient Instructions (Addendum)
benicar 20/12.5 one daily  Stop lisinopril and nexium    Take delsym two tsp every 12 hours and supplement if needed with  tramadol 50 mg up to 1-2 every 4 hours to suppress the urge to cough. Swallowing water or using ice chips/non mint and menthol containing candies (such as lifesavers or sugarless jolly ranchers) are also effective.  You should rest your voice and avoid activities that you know make you cough.  Once you have eliminated the cough for 3 straight days try reducing the tramadol first,  then the delsym as tolerated.   Prednisone 10 mg take  4 each am x 2 days,   2 each am x 2 days,  1 each am x 2 days and stop   Pantoprazole (protonix) 40 mg   Take 30-60 min before first meal of the day and Pepcid ac 20 mg one bedtime until return to office - this is the best way to tell whether stomach acid is contributing to your problem.    GERD (REFLUX)  is an extremely common cause of respiratory symptoms, many times with no significant heartburn at all.    It can be treated with medication, but also with lifestyle changes including avoidance of late meals, excessive alcohol, smoking cessation, and avoid fatty foods, chocolate, peppermint, colas, red wine, and acidic juices such as orange juice.  NO MINT OR MENTHOL PRODUCTS SO NO COUGH DROPS  USE SUGARLESS CANDY INSTEAD (jolley ranchers or Stover's)  NO OIL BASED VITAMINS - use powdered substitutes.   Please remember to go to the  x-ray department downstairs for your tests - we will call you with the results when they are available.     Please schedule a follow up office visit in 2  weeks, sooner if needed

## 2014-07-25 NOTE — Progress Notes (Signed)
Subjective:    Patient ID: Terri [name], female    DOB: Oct 17, 1951  MRN: [id number]  HPI  [age] yowf never smoker never problem as child or young adult without  any resp problems then fall of 2014 started coughing on ACEi but not better p stopped so resumed it and referred by [name] to pulmonary clinic 07/25/2014    07/25/2014 1st [facility] office visit/ Neriah Brott   Chief Complaint  Patient presents with  . Pulmonary Consult    Referred by [name]. Pt c/o cough for the past year.  Cough is prod with minimal to moderate cream colored sputum.      In general day > night. Has it every day with sense of a lot of throat drainage but minimal in am when wakes   Kouffman Reflux v Neurogenic Cough Differentiator Reflux Comments  Do you awaken from a sound sleep coughing violently?                            With trouble breathing? no   Do you have choking episodes when you cannot  Get enough air, gasping for air ?              Yes   Do you usually cough when you lie down into  The bed, or when you just lie down to rest ?                          Yes   Do you usually cough after meals or eating?         no   Do you cough when (or after) you bend over?    no   GERD SCORE     Kouffman Reflux v Neurogenic Cough Differentiator Neurogenic   Do you more-or-less cough all day long? yes   Does change of temperature make you cough? no   Does laughing or chuckling cause you to cough? yes   Do fumes (perfume, automobile fumes, burned  Toast, etc.,) cause you to cough ?      no   Does speaking, singing, or talking on the phone cause you to cough   ?               yes   Neurogenic/Airway score      Review of Systems  Constitutional: Negative for fever, chills and unexpected weight change.  HENT: Negative for congestion, dental problem, ear pain, nosebleeds, postnasal drip, rhinorrhea, sinus pressure, sneezing, sore throat, trouble swallowing and voice change.   Eyes: Negative for visual  disturbance.  Respiratory: Positive for cough. Negative for choking and shortness of breath.   Cardiovascular: Negative for chest pain and leg swelling.  Gastrointestinal: Positive for abdominal pain. Negative for vomiting and diarrhea.  Genitourinary: Negative for difficulty urinating.  Musculoskeletal: Negative for arthralgias.  Skin: Negative for rash.  Neurological: Positive for headaches. Negative for tremors and syncope.  Hematological: Does not bruise/bleed easily.       Objective:   Physical Exam  amb wf nad  Wt Readings from Last 3 Encounters:  07/25/14 218 lb 9.6 oz (99.156 kg)      HEENT: nl dentition, turbinates, and orophanx. Nl external ear canals without cough reflex   NECK :  without JVD/Nodes/TM/ nl carotid upstrokes bilaterally   LUNGS: no acc muscle use, clear to A and P bilaterally without cough on insp or  exp maneuvers   CV:  RRR  no s3 or murmur or increase in P2, no edema   ABD:  soft and nontender with nl excursion in the supine position. No bruits or organomegaly, bowel sounds nl  MS:  warm without deformities, calf tenderness, cyanosis or clubbing  SKIN: warm and dry without lesions    NEURO:  alert, approp, no deficits   CXR  07/25/2014 :  Trachea is midline. Heart size normal. Lungs are clear. No pleural  fluid. Old left rib fractures         Assessment & Plan:

## 2014-07-26 DIAGNOSIS — I10 Essential (primary) hypertension: Secondary | ICD-10-CM | POA: Insufficient documentation

## 2014-07-26 NOTE — Assessment & Plan Note (Signed)
ACE inhibitors are problematic in  pts with airway complaints because  even experienced pulmonologists can't always distinguish ace effects from copd/asthma.  By themselves they don't actually cause a problem, much like oxygen can't by itself start a fire, but they certainly serve as a powerful catalyst or enhancer for any "fire"  or inflammatory process in the upper airway, be it caused by an ET  tube or more commonly reflux (especially in the obese or pts with known GERD or who are on biphoshonates).    In the era of ARB near equivalency until we have a better handle on the reversibility of the airway problem, it just makes sense to avoid ACEI  entirely in the short run and then decide later, having established a level of airway control using a reasonable limited regimen, whether to add back ace but even then being very careful to observe the pt for worsening airway control and number of meds used/ needed to control symptoms.    For the immediate future, until cough is eliminated, rec arb in form of samples for now of benicar 20/12.5 daily

## 2014-07-26 NOTE — Assessment & Plan Note (Signed)
The most common causes of chronic cough in immunocompetent adults include the following: upper airway cough syndrome (UACS), previously referred to as postnasal drip syndrome (PNDS), which is caused by variety of rhinosinus conditions; (2) asthma; (3) GERD; (4) chronic bronchitis from cigarette smoking or other inhaled environmental irritants; (5) nonasthmatic eosinophilic bronchitis; and (6) bronchiectasis.   These conditions, singly or in combination, have accounted for up to 94% of the causes of chronic cough in prospective studies.   Other conditions have constituted no >6% of the causes in prospective studies These have included bronchogenic carcinoma, chronic interstitial pneumonia, sarcoidosis, left ventricular failure, ACEI-induced cough, and aspiration from a condition associated with pharyngeal dysfunction.    Chronic cough is often simultaneously caused by more than one condition. A single cause has been found from 38 to 82% of the time, multiple causes from 18 to 62%. Multiply caused cough has been the result of three diseases up to 42% of the time.       Based on hx and exam, this is most likely:  Classic Upper airway cough syndrome, so named because it's frequently impossible to sort out how much is  CR/sinusitis with freq throat clearing (which can be related to primary GERD)   vs  causing  secondary (" extra esophageal")  GERD from wide swings in gastric pressure that occur with throat clearing, often  promoting self use of mint and menthol lozenges that reduce the lower esophageal sphincter tone and exacerbate the problem further in a cyclical fashion.   These are the same pts (now being labeled as having "irritable larynx syndrome" by some cough centers) who not infrequently have a history of having failed to tolerate ace inhibitors,  dry powder inhalers or biphosphonates or report having atypical reflux symptoms that don't respond to standard doses of PPI , and are easily confused as  having aecopd or asthma flares by even experienced allergists/ pulmonologists.   The first step is to maximize acid suppression and eliminate acei and  cyclical coughing then regroup if the cough persists.  See instructions for specific recommendations which were reviewed directly with the patient who was given a copy with highlighter outlining the key components.

## 2014-07-28 ENCOUNTER — Telehealth: Payer: Self-pay | Admitting: Internal Medicine

## 2014-07-28 NOTE — Telephone Encounter (Signed)
Call pt: Reviewed cxr and no acute change so no change in recommendations made at ov  Spoke with pt and notified of results per Dr. Wert. Pt verbalized understanding and denied any questions.  

## 2014-07-28 NOTE — Progress Notes (Signed)
Quick Note:  Spoke with pt and notified of results per Dr. Wert. Pt verbalized understanding and denied any questions.  ______ 

## 2014-08-07 ENCOUNTER — Encounter: Payer: Self-pay | Admitting: Internal Medicine

## 2014-08-07 ENCOUNTER — Ambulatory Visit (INDEPENDENT_AMBULATORY_CARE_PROVIDER_SITE_OTHER): Payer: PRIVATE HEALTH INSURANCE | Admitting: Internal Medicine

## 2014-08-07 VITALS — BP 112/70 | HR 75 | Temp 98.1°F | Ht 64.0 in | Wt 219.6 lb

## 2014-08-07 DIAGNOSIS — R058 Other specified cough: Secondary | ICD-10-CM

## 2014-08-07 DIAGNOSIS — R05 Cough: Secondary | ICD-10-CM

## 2014-08-07 DIAGNOSIS — I10 Essential (primary) hypertension: Secondary | ICD-10-CM

## 2014-08-07 MED ORDER — OLMESARTAN MEDOXOMIL-HCTZ 20-12.5 MG PO TABS: 1.0000 | ORAL_TABLET | Freq: Every day | ORAL | Status: AC

## 2014-08-07 NOTE — Patient Instructions (Addendum)
Continue the benicar 20/12.5 - called in today  Use delsym as needed for the cough and supplement with the tramadol if needed  Please schedule a follow up office visit in 4 weeks, sooner if needed

## 2014-08-07 NOTE — Progress Notes (Addendum)
Subjective:    Patient ID: Terri [name], female    DOB: [birth year]  MRN: [id number]    Brief patient profile:  [age] yowf never smoker never problem as child or young adult without  any resp problems then fall of 2014 started coughing on ACEi but not better p stopped so resumed it and referred by [name] to pulmonary clinic 07/25/2014    History of Present Illness  07/25/2014 1st [facility] office visit/ Terri [name]   Chief Complaint  Patient presents with  . Pulmonary Consult    Referred by [name]. Pt c/o cough for the past year.  Cough is prod with minimal to moderate cream colored sputum.      In general day > night. Has it every day with sense of a lot of throat drainage but minimal in am when wakes   Kouffman Reflux v Neurogenic Cough Differentiator Reflux Comments  Do you awaken from a sound sleep coughing violently?                            With trouble breathing? no   Do you have choking episodes when you cannot  Get enough air, gasping for air ?              Yes   Do you usually cough when you lie down into  The bed, or when you just lie down to rest ?                          Yes   Do you usually cough after meals or eating?         no   Do you cough when (or after) you bend over?    no   GERD SCORE     Kouffman Reflux v Neurogenic Cough Differentiator Neurogenic   Do you more-or-less cough all day long? yes   Does change of temperature make you cough? no   Does laughing or chuckling cause you to cough? yes   Do fumes (perfume, automobile fumes, burned  Toast, etc.,) cause you to cough ?      no   Does speaking, singing, or talking on the phone cause you to cough   ?               yes   Neurogenic/Airway score      rec benicar 20/12.5 one daily Stop lisinopril and nexium   Take delsym two tsp every 12 hours and supplement if needed with  tramadol 50 mg up to 1-2 every 4 hours  Prednisone 10 mg take  4 each am x 2 days,   2 each am x 2 days,  1 each am x 2  days and stop  Pantoprazole (protonix) 40 mg   Take 30-60 min before first meal of the day and Pepcid ac 20 mg one bedtime GERD diet     08/07/2014 f/u ov/Terri [name] re: upper airway cough syndrome Chief Complaint  Patient presents with  . Follow-up    Cough is much better no new co's today.  Not limited by breathing from desired activities  - still using occ tramadol for dry daytime cough but 90% improved overall   No  cp or chest tightness, subjective wheeze overt sinus or hb symptoms. No unusual exp hx or h/o childhood pna/ asthma or knowledge of premature birth.  Sleeping ok without nocturnal  or early am exacerbation  of respiratory  c/o's or need for noct saba. Also denies any obvious fluctuation of symptoms with weather or environmental changes or other aggravating or alleviating factors except as outlined above   Current Medications, Allergies, Complete Past Medical History, Past Surgical History, Family History, and Social History were reviewed in [facility] electronic medical record.  ROS  The following are not active complaints unless bolded sore throat, dysphagia, dental problems, itching, sneezing,  nasal congestion or excess/ purulent secretions, ear ache,   fever, chills, sweats, unintended wt loss, pleuritic or exertional cp, hemoptysis,  orthopnea pnd or leg swelling, presyncope, palpitations, heartburn, abdominal pain, anorexia, nausea, vomiting, diarrhea  or change in bowel or urinary habits, change in stools or urine, dysuria,hematuria,  rash, arthralgias, visual complaints, headache, numbness weakness or ataxia or problems with walking or coordination,  change in mood/affect or memory.           Objective:   Physical Exam  amb wf nad  Wt Readings from Last 3 Encounters:  08/07/14 219 lb 9.6 oz (99.61 kg)  07/25/14 218 lb 9.6 oz (99.156 kg)         HEENT: nl dentition, turbinates, and orophanx. Nl external ear canals without cough reflex   NECK :  without  JVD/Nodes/TM/ nl carotid upstrokes bilaterally   LUNGS: no acc muscle use, clear to A and P bilaterally without cough on insp or exp maneuvers   CV:  RRR  no s3 or murmur or increase in P2, no edema   ABD:  soft and nontender with nl excursion in the supine position. No bruits or organomegaly, bowel sounds nl  MS:  warm without deformities, calf tenderness, cyanosis or clubbing  SKIN: warm and dry without lesions    NEURO:  alert, approp, no deficits   CXR  07/25/2014 : Trachea is midline. Heart size normal. Lungs are clear. No pleural  fluid. Old left rib fractures         Assessment & Plan:

## 2014-08-11 NOTE — Assessment & Plan Note (Addendum)
Resolved off acei/ with max gerd rx and elimination of cyclical coughing- could still flare back up off tramadol which she should no longer need at this point  rec max gerd rx, nothing stronger than delsym for cough x 4 weeks then regroup to decide what she needs longterm

## 2014-08-11 NOTE — Assessment & Plan Note (Addendum)
Adequate control on present rx, reviewed > no change in rx needed    Would avoid acei in this setting not because it causes coughing but because it's such a powerful catalyst for pts who have uacs from any other reason (anything that stimulate upper airway inflammation will active the bradykinin pathway which is augmentin in every single patient who takes acei consistently and can take up to 6 weeks to normalize at the tissue level)  For now continue benicar 20/12.5 mg daily

## 2014-09-04 ENCOUNTER — Encounter: Payer: Self-pay | Admitting: Internal Medicine

## 2014-09-04 ENCOUNTER — Ambulatory Visit (INDEPENDENT_AMBULATORY_CARE_PROVIDER_SITE_OTHER)
Admission: RE | Admit: 2014-09-04 | Discharge: 2014-09-04 | Disposition: A | Discharge status: Home / Self Care | Payer: PRIVATE HEALTH INSURANCE | Source: Ambulatory Visit | Attending: Internal Medicine | Admitting: Internal Medicine

## 2014-09-04 ENCOUNTER — Ambulatory Visit (INDEPENDENT_AMBULATORY_CARE_PROVIDER_SITE_OTHER): Payer: PRIVATE HEALTH INSURANCE | Admitting: Internal Medicine

## 2014-09-04 ENCOUNTER — Other Ambulatory Visit (INDEPENDENT_AMBULATORY_CARE_PROVIDER_SITE_OTHER): Payer: PRIVATE HEALTH INSURANCE

## 2014-09-04 VITALS — BP 124/70 | HR 80 | Ht 64.0 in | Wt 223.2 lb

## 2014-09-04 DIAGNOSIS — R05 Cough: Secondary | ICD-10-CM

## 2014-09-04 DIAGNOSIS — I10 Essential (primary) hypertension: Secondary | ICD-10-CM

## 2014-09-04 DIAGNOSIS — R058 Other specified cough: Secondary | ICD-10-CM

## 2014-09-04 LAB — CBC WITH DIFFERENTIAL/PLATELET
BASOS ABS: 0 10*3/uL (ref 0.0–0.1)
BASOS PCT: 0.5 % (ref 0.0–3.0)
EOS ABS: 0.1 10*3/uL (ref 0.0–0.7)
Eosinophils Relative: 1.3 % (ref 0.0–5.0)
HCT: 40.9 % (ref 36.0–46.0)
Hemoglobin: 13.5 g/dL (ref 12.0–15.0)
LYMPHS ABS: 2.4 10*3/uL (ref 0.7–4.0)
LYMPHS PCT: 30.5 % (ref 12.0–46.0)
MCHC: 33.1 g/dL (ref 30.0–36.0)
MCV: 86.9 fl (ref 78.0–100.0)
MONO ABS: 0.4 10*3/uL (ref 0.1–1.0)
MONOS PCT: 5.6 % (ref 3.0–12.0)
NEUTROS ABS: 4.9 10*3/uL (ref 1.4–7.7)
Neutrophils Relative %: 62.1 % (ref 43.0–77.0)
Platelets: 178 10*3/uL (ref 150.0–400.0)
RBC: 4.7 Mil/uL (ref 3.87–5.11)
RDW: 13.6 % (ref 11.5–15.5)
WBC: 7.8 10*3/uL (ref 4.0–10.5)

## 2014-09-04 MED ORDER — TRAMADOL HCL 50 MG PO TABS: ORAL_TABLET | ORAL | Status: DC

## 2014-09-04 MED ORDER — BENZONATATE 200 MG PO CAPS: ORAL_CAPSULE | ORAL | Status: DC

## 2014-09-04 MED ORDER — PREDNISONE 10 MG PO TABS: ORAL_TABLET | ORAL | Status: DC

## 2014-09-04 NOTE — Patient Instructions (Addendum)
The key to effective treatment for your cough is eliminating the non-stop cycle of cough you're stuck in long enough to let your airway heal completely and then see if there is anything still making you cough once you stop the cough suppression, but this should take no more than 5 days to figure out  First take delsym two tsp every 12 hours and supplement if needed with  tramadol 50 mg up to 2 every 4 hours to suppress the urge to cough at all or even clear your throat. Swallowing water or using ice chips/non mint and menthol containing candies (such as lifesavers or sugarless jolly ranchers) are also effective.  You should rest your voice and avoid activities that you know make you cough.  Once you have eliminated the cough for 3 straight days try reducing the tramadol first,  then the delsym as tolerated.    Prednisone 10 mg take  4 each am x 2 days,   2 each am x 2 days,  1 each am x 2 days and stop (this is to eliminate allergies and inflammation from coughing)  Protonix (pantoprazole) Take 30-60 min before first meal of the day and Pepcid 20 mg one bedtime plus chlorpheniramine 4 mg x 2 at bedtime (both available over the counter)  until cough is completely gone for at least a week without the need for cough suppression  GERD (REFLUX)  is an extremely common cause of respiratory symptoms, many times with no significant heartburn at all.    It can be treated with medication, but also with lifestyle changes including avoidance of late meals, excessive alcohol, smoking cessation, and avoid fatty foods, chocolate, peppermint, colas, red wine, and acidic juices such as orange juice.  NO MINT OR MENTHOL PRODUCTS SO NO COUGH DROPS  USE HARD CANDY INSTEAD (jolley ranchers or Stover's or Lifesavers (all available in sugarless versions) NO OIL BASED VITAMINS - use powdered substitutes.  Please remember to go to the lab department downstairs for your tests - we will call you with the results when they are  available.     Please schedule a follow up office visit in 4 weeks, sooner if needed  Late add also use tessilon

## 2014-09-04 NOTE — Progress Notes (Signed)
Subjective:    Patient ID: Terri [name], female    DOB: Nov 30, 1950  MRN: [id number]   Brief patient profile:  [id number] yowf never smoker never problem as child or young adult without  any resp problems then fall of 2014 started coughing on ACEi but not better p stopped so resumed it and referred by Terri [name] to pulmonary clinic 07/25/2014 for refractory cough.   History of Present Illness  07/25/2014 1st [facility] office visit/ Terri [name]   Chief Complaint  Patient presents with  . Pulmonary Consult    Referred by Terri [name]. Pt c/o cough for the past year.  Cough is prod with minimal to moderate cream colored sputum.      In general day > night. Has it every day with sense of a lot of throat drainage but minimal in am when wakes   Terri [name] Reflux v Neurogenic Cough Differentiator Reflux Comments  Do you awaken from a sound sleep coughing violently?                            With trouble breathing? no   Do you have choking episodes when you cannot  Get enough air, gasping for air ?              Yes   Do you usually cough when you lie down into  The bed, or when you just lie down to rest ?                          Yes   Do you usually cough after meals or eating?         no   Do you cough when (or after) you bend over?    no   GERD SCORE     Terri [name] Reflux v Neurogenic Cough Differentiator Neurogenic   Do you more-or-less cough all day long? yes   Does change of temperature make you cough? no   Does laughing or chuckling cause you to cough? yes   Do fumes (perfume, automobile fumes, burned  Toast, etc.,) cause you to cough ?      no   Does speaking, singing, or talking on the phone cause you to cough   ?               yes   Neurogenic/Airway score      rec benicar 20/12.5 one daily Stop lisinopril and nexium   Take delsym two tsp every 12 hours and supplement if needed with  tramadol 50 mg up to 1-2 every 4 hours  Prednisone 10 mg take  4 each am x 2 days,   2 each am x 2  days,  1 each am x 2 days and stop  Pantoprazole (protonix) 40 mg   Take 30-60 min before first meal of the day and Pepcid ac 20 mg one bedtime GERD diet     08/07/2014 f/u ov/Terri [name] re: upper airway cough syndrome Chief Complaint  Patient presents with  . Follow-up    Cough is much better no new co's today.  Not limited by breathing from desired activities  - still using occ tramadol for dry daytime cough but 90% improved overall  rec Continue the benicar 20/12.5 - called in today Use delsym as needed for the cough and supplement with the tramadol if needed   09/04/2014 f/u ov/Terri [name] re: persistent  cough now  6 weeks of ACEi  Chief Complaint  Patient presents with  . Follow-up    Pt c/o increased cough for the past 2 wks. Cough is prod with minimal cream colored sputum.   more day than night, more dry than wet   No sob  cp or chest tightness, subjective wheeze overt sinus or hb symptoms. No unusual exp hx or h/o childhood pna/ asthma or knowledge of premature birth.  Sleeping ok without nocturnal  or early am exacerbation  of respiratory  c/o's or need for noct saba. Also denies any obvious fluctuation of symptoms with weather or environmental changes or other aggravating or alleviating factors except as outlined above   Current Medications, Allergies, Complete Past Medical History, Past Surgical History, Family History, and Social History were reviewed in [name] record.  ROS  The following are not active complaints unless bolded sore throat, dysphagia, dental problems, itching, sneezing,  nasal congestion or excess/ purulent secretions, ear ache,   fever, chills, sweats, unintended wt loss, pleuritic or exertional cp, hemoptysis,  orthopnea pnd or leg swelling, presyncope, palpitations, heartburn, abdominal pain, anorexia, nausea, vomiting, diarrhea  or change in bowel or urinary habits, change in stools or urine, dysuria,hematuria,  rash, arthralgias, visual  complaints, headache, numbness weakness or ataxia or problems with walking or coordination,  change in mood/affect or memory.           Objective:   Physical Exam  amb wf nad  Wt Readings from Last 3 Encounters:  09/04/14 223 lb 3.2 oz (101.243 kg)  08/07/14 219 lb 9.6 oz (99.61 kg)  07/25/14 218 lb 9.6 oz (99.156 kg)    Vital signs reviewed         HEENT: nl dentition, turbinates, and orophanx. Nl external ear canals without cough reflex   NECK :  without JVD/Nodes/TM/ nl carotid upstrokes bilaterally   LUNGS: no acc muscle use, clear to A and P bilaterally without cough on insp or exp maneuvers   CV:  RRR  no s3 or murmur or increase in P2, no edema   ABD:  soft and nontender with nl excursion in the supine position. No bruits or organomegaly, bowel sounds nl  MS:  warm without deformities, calf tenderness, cyanosis or clubbing  SKIN: warm and dry without lesions    NEURO:  alert, approp, no deficits   CXR  07/25/2014 : Trachea is midline. Heart size normal. Lungs are clear. No pleural  fluid. Old left rib fractures         Assessment & Plan:   Outpatient Encounter Prescriptions as of 09/04/2014  Medication Sig  . aspirin 81 MG tablet Take 81 mg by mouth daily.  . Cholecalciferol (VITAMIN D PO) Take 1 tablet by mouth daily.  . famotidine (PEPCID) 20 MG tablet One at bedtime  . nitroGLYCERIN (NITROSTAT) 0.4 MG SL tablet Place 0.4 mg under the tongue every 5 (five) minutes as needed for chest pain.  Marland Kitchen olmesartan-hydrochlorothiazide (BENICAR HCT) 20-12.5 MG per tablet Take 1 tablet by mouth daily.  . pantoprazole (PROTONIX) 40 MG tablet Take 1 tablet (40 mg total) by mouth daily. Take 30-60 min before first meal of the day  . [DISCONTINUED] dextromethorphan (DELSYM) 30 MG/5ML liquid 2 tsp every 12 hours as needed  . benzonatate (TESSALON) 200 MG capsule One every 4 hours as needed  . predniSONE (DELTASONE) 10 MG tablet Take  4 each am x 2 days,   2 each am x  2 days,  1 each am x 2 days and stop  . traMADol (ULTRAM) 50 MG tablet 1-2 every 4 hours as needed for cough or pain  . [DISCONTINUED] traMADol (ULTRAM) 50 MG tablet 1-2 every 4 hours as needed for cough or pain

## 2014-09-05 ENCOUNTER — Telehealth: Payer: Self-pay | Admitting: Internal Medicine

## 2014-09-05 LAB — ALLERGY FULL PROFILE
Allergen,Goose feathers, e70: <0.1 kU/L
Aspergillus fumigatus, m3: <0.1 kU/L
Bermuda Grass: <0.1 kU/L
Cat Dander: <0.1 kU/L
D. farinae: <0.1 kU/L
Dog Dander: <0.1 kU/L
G005 Rye, Perennial: <0.1 kU/L
Goldenrod: <0.1 kU/L
House Dust Hollister: <0.1 kU/L
IgE (Immunoglobulin E), Serum: 18 kU/L (ref <115)
Lamb's Quarters: <0.1 kU/L
Oak: <0.1 kU/L
Plantain: <0.1 kU/L
Sycamore Tree: <0.1 kU/L

## 2014-09-05 NOTE — Telephone Encounter (Signed)
Patient notified of CT results.  Nothing further needed. 

## 2014-09-05 NOTE — Progress Notes (Signed)
Quick Note:  LMTCB ______ 

## 2014-09-06 ENCOUNTER — Encounter: Payer: Self-pay | Admitting: Internal Medicine

## 2014-09-06 NOTE — Assessment & Plan Note (Addendum)
-   off acei as of 07/26/14  - 09/04/2014 sinus CT > min thickening s a/f level  - 09/04/14 Allergy profile IgE 18 and neg RAST,  Eos 0   At this point she should be better off acei suggesting there may be another cause, which occurs in some series as much as 40% of the time  The standardized cough guidelines published in Chest by [employer]win in 2006 are still the best available and consist of a multiple step process (up to 12!) , not a single office visit,  and are intended  to address this problem logically,  with an alogrithm dependent on response to empiric treatment at  each progressive step  to determine a specific diagnosis with  minimal addtional testing needed. Therefore if adherence is an issue or can't be accurately verified,  it's very unlikely the standard evaluation and treatment will be successful here.    Furthermore, response to therapy (other than acute cough suppression, which should only be used short term with avoidance of narcotic containing cough syrups if possible), can be a gradual process for which the patient may perceive immediate benefit.  Unlike going to an eye doctor where the best perscription is almost always the first one and is immediately effective, this is almost never the case in the management of chronic cough syndromes. Therefore the patient needs to commit up front to consistently adhere to recommendations  for up to 6 weeks of therapy directed at the likely underlying problem(s) before the response can be reasonably evaluated.   For now will focus on rx of gerd and elimination of cyclical coughing and regroup in 4 weeks     Each maintenance medication was reviewed in detail including most importantly the difference between maintenance and as needed and under what circumstances the prns are to be used.  Please see instructions for details which were reviewed in writing and the patient given a copy.

## 2014-09-06 NOTE — Assessment & Plan Note (Signed)
rechallenged off acei as of 07/26/2014 >   Adequate control on present rx, reviewed > no change in rx for now and frankly don't feel acei benefit is worth ever rechallenging her given how incessant this cough pattern, with classic uacs features, has become.

## 2015-02-10 ENCOUNTER — Emergency Department (HOSPITAL_BASED_OUTPATIENT_CLINIC_OR_DEPARTMENT_OTHER): Payer: PRIVATE HEALTH INSURANCE

## 2015-02-10 ENCOUNTER — Encounter (HOSPITAL_BASED_OUTPATIENT_CLINIC_OR_DEPARTMENT_OTHER): Payer: Self-pay | Admitting: *Deleted

## 2015-02-10 ENCOUNTER — Inpatient Hospital Stay (HOSPITAL_BASED_OUTPATIENT_CLINIC_OR_DEPARTMENT_OTHER)
Admission: EM | Admit: 2015-02-10 | Discharge: 2015-02-13 | DRG: 409 | Disposition: A | Discharge status: Home Health | Payer: PRIVATE HEALTH INSURANCE | Attending: General Surgery | Admitting: General Surgery

## 2015-02-10 DIAGNOSIS — K219 Gastro-esophageal reflux disease without esophagitis: Secondary | ICD-10-CM | POA: Diagnosis present

## 2015-02-10 DIAGNOSIS — K8000 Calculus of gallbladder with acute cholecystitis without obstruction: Secondary | ICD-10-CM | POA: Diagnosis not present

## 2015-02-10 DIAGNOSIS — Z79891 Long term (current) use of opiate analgesic: Secondary | ICD-10-CM

## 2015-02-10 DIAGNOSIS — Z79899 Other long term (current) drug therapy: Secondary | ICD-10-CM

## 2015-02-10 DIAGNOSIS — K810 Acute cholecystitis: Secondary | ICD-10-CM | POA: Diagnosis present

## 2015-02-10 DIAGNOSIS — I10 Essential (primary) hypertension: Secondary | ICD-10-CM | POA: Diagnosis present

## 2015-02-10 DIAGNOSIS — R1011 Right upper quadrant pain: Secondary | ICD-10-CM | POA: Diagnosis not present

## 2015-02-10 DIAGNOSIS — Z7952 Long term (current) use of systemic steroids: Secondary | ICD-10-CM

## 2015-02-10 DIAGNOSIS — Z7982 Long term (current) use of aspirin: Secondary | ICD-10-CM

## 2015-02-10 DIAGNOSIS — K5190 Ulcerative colitis, unspecified, without complications: Secondary | ICD-10-CM | POA: Diagnosis present

## 2015-02-10 DIAGNOSIS — Z791 Long term (current) use of non-steroidal anti-inflammatories (NSAID): Secondary | ICD-10-CM

## 2015-02-10 DIAGNOSIS — Z803 Family history of malignant neoplasm of breast: Secondary | ICD-10-CM

## 2015-02-10 HISTORY — DX: Adverse effect of unspecified anesthetic, initial encounter: T41.45XA

## 2015-02-10 HISTORY — DX: Other complications of anesthesia, initial encounter: T88.59XA

## 2015-02-10 LAB — CBC WITH DIFFERENTIAL/PLATELET
Basophils Absolute: 0 10*3/uL (ref 0.0–0.1)
Basophils Relative: 0 % (ref 0–1)
EOS PCT: 1 % (ref 0–5)
Eosinophils Absolute: 0.1 10*3/uL (ref 0.0–0.7)
HCT: 38.8 % (ref 36.0–46.0)
Hemoglobin: 12.7 g/dL (ref 12.0–15.0)
LYMPHS PCT: 21 % (ref 12–46)
Lymphs Abs: 2.4 10*3/uL (ref 0.7–4.0)
MCH: 29.1 pg (ref 26.0–34.0)
MCHC: 32.7 g/dL (ref 30.0–36.0)
MCV: 89 fL (ref 78.0–100.0)
Monocytes Absolute: 0.8 10*3/uL (ref 0.1–1.0)
Monocytes Relative: 7 % (ref 3–12)
NEUTROS PCT: 71 % (ref 43–77)
Neutro Abs: 8.2 10*3/uL — ABNORMAL HIGH (ref 1.7–7.7)
Platelets: 186 10*3/uL (ref 150–400)
RBC: 4.36 MIL/uL (ref 3.87–5.11)
RDW: 12.8 % (ref 11.5–15.5)
WBC: 11.5 10*3/uL — ABNORMAL HIGH (ref 4.0–10.5)

## 2015-02-10 LAB — COMPREHENSIVE METABOLIC PANEL
ALK PHOS: 78 U/L (ref 39–117)
ALT: 32 U/L (ref 0–35)
ANION GAP: 8 (ref 5–15)
AST: 34 U/L (ref 0–37)
Albumin: 4 g/dL (ref 3.5–5.2)
BUN: 18 mg/dL (ref 6–23)
CO2: 27 mmol/L (ref 19–32)
Calcium: 9.2 mg/dL (ref 8.4–10.5)
Chloride: 100 mmol/L (ref 96–112)
Creatinine, Ser: 0.8 mg/dL (ref 0.50–1.10)
GFR calc Af Amer: >90 mL/min (ref >90)
GFR, EST NON AFRICAN AMERICAN: 78 mL/min — AB (ref >90)
Glucose, Bld: 119 mg/dL — ABNORMAL HIGH (ref 70–99)
POTASSIUM: 4 mmol/L (ref 3.5–5.1)
SODIUM: 135 mmol/L (ref 135–145)
Total Bilirubin: 1.1 mg/dL (ref 0.3–1.2)
Total Protein: 7.5 g/dL (ref 6.0–8.3)

## 2015-02-10 LAB — LIPASE, BLOOD: LIPASE: 21 U/L (ref 11–59)

## 2015-02-10 MED ORDER — SODIUM CHLORIDE 0.9 % IV SOLN
3.0000 g | Freq: Once | INTRAVENOUS | Status: AC
  Administered 2015-02-11: 3 g via INTRAVENOUS
  Filled 2015-02-10: qty 3

## 2015-02-10 MED ORDER — IOHEXOL 300 MG/ML  SOLN
25.0000 mL | Freq: Once | INTRAMUSCULAR | Status: AC | PRN
  Administered 2015-02-10: 25 mL via ORAL

## 2015-02-10 MED ORDER — IOHEXOL 300 MG/ML  SOLN
100.0000 mL | Freq: Once | INTRAMUSCULAR | Status: AC | PRN
  Administered 2015-02-10: 100 mL via INTRAVENOUS

## 2015-02-10 MED ORDER — FENTANYL CITRATE (PF) 100 MCG/2ML IJ SOLN
50.0000 ug | Freq: Once | INTRAMUSCULAR | Status: DC
  Filled 2015-02-10: qty 2

## 2015-02-10 MED ORDER — SODIUM CHLORIDE 0.9 % IV BOLUS (SEPSIS)
500.0000 mL | Freq: Once | INTRAVENOUS | Status: AC
  Administered 2015-02-10: 500 mL via INTRAVENOUS

## 2015-02-10 MED ORDER — ONDANSETRON HCL 4 MG/2ML IJ SOLN
4.0000 mg | Freq: Once | INTRAMUSCULAR | Status: AC
  Filled 2015-02-10: qty 2

## 2015-02-10 NOTE — ED Notes (Signed)
Patient refuses pain and nausea medicine at this time.  Reports she would like to wait and will notify staff if she feels like she needs it.

## 2015-02-10 NOTE — ED Provider Notes (Signed)
CSN: [id number]     Arrival date & time 02/10/15  1906 History  This chart was scribed for Benjiman Core, MD by Phillis Haggis, ED Scribe. This patient was seen in room MH12/MH12 and patient care was started at 9:14 PM.   Chief Complaint  Patient presents with  . Abdominal Pain   Patient is a 64 y.o. female presenting with abdominal pain. The history is provided by the patient. No language interpreter was used.  Abdominal Pain Associated symptoms: chest pain     HPI Comments: Terri [name] is a 64 y.o. female who presents to the Emergency Department complaining of abdominal pain onset 4 days ago. She states that the pain felt like she ate food that did not agree with her. She states that on Saturday, the pain radiated to her chest and back to the point where she could not sleep. She reports associated bloating and pain upon palpation. She states that the pain has now settled to her right flank. She reports a history of ulcerative colitis where her appendix and colon were removed, but states that she still has her gall bladder.   Past Medical History  Diagnosis Date  . Hypertension   . Seizure    Past Surgical History  Procedure Laterality Date  . Colon surgery    . Appendectomy     Family History  Problem Relation Age of Onset  . Breast cancer Sister   . Rheum arthritis Sister    History  Substance Use Topics  . Smoking status: Never Smoker   . Smokeless tobacco: Never Used  . Alcohol Use: No   OB History    No data available     Review of Systems  Cardiovascular: Positive for chest pain.  Gastrointestinal: Positive for abdominal pain.  Genitourinary: Positive for flank pain.  Musculoskeletal: Positive for back pain.  All other systems reviewed and are negative.  Allergies  Review of patient's allergies indicates no known allergies.  Home Medications   Prior to Admission medications   Medication Sig Start Date End Date Taking? Authorizing Provider  aspirin 81 MG  tablet Take 81 mg by mouth daily.    Historical Provider, MD  benzonatate (TESSALON) 200 MG capsule One every 4 hours as needed 09/04/14   Nyoka Cowden, MD  Cholecalciferol (VITAMIN D PO) Take 1 tablet by mouth daily.    Historical Provider, MD  famotidine (PEPCID) 20 MG tablet One at bedtime 07/25/14   Nyoka Cowden, MD  nitroGLYCERIN (NITROSTAT) 0.4 MG SL tablet Place 0.4 mg under the tongue every 5 (five) minutes as needed for chest pain.    Historical Provider, MD  olmesartan-hydrochlorothiazide (BENICAR HCT) 20-12.5 MG per tablet Take 1 tablet by mouth daily. 08/07/14   Nyoka Cowden, MD  pantoprazole (PROTONIX) 40 MG tablet Take 1 tablet (40 mg total) by mouth daily. Take 30-60 min before first meal of the day 07/25/14   Nyoka Cowden, MD  predniSONE (DELTASONE) 10 MG tablet Take  4 each am x 2 days,   2 each am x 2 days,  1 each am x 2 days and stop 09/04/14   Nyoka Cowden, MD  traMADol (ULTRAM) 50 MG tablet 1-2 every 4 hours as needed for cough or pain 09/04/14   Nyoka Cowden, MD   BP 129/76 mmHg  Pulse 111  Temp(Src) 98.5 F (36.9 C) (Oral)  Resp 18  Ht  (1.626 m)  Wt 227 lb (102.967 kg)  BMI  38.95 kg/m2  SpO2 97%   Physical Exam  Constitutional: She appears well-developed and well-nourished. No distress.  HENT:  Head: Normocephalic and atraumatic.  Eyes: Conjunctivae are normal. Right eye exhibits no discharge. Left eye exhibits no discharge.  Neck: Neck supple.  Cardiovascular: Normal rate, regular rhythm and normal heart sounds.  Exam reveals no gallop and no friction rub.   No murmur heard. Pulmonary/Chest: Effort normal and breath sounds normal. No respiratory distress.  Abdominal: Soft. She exhibits no distension. There is no tenderness.  Moderate RUQ tenderness with some guarding  Musculoskeletal: She exhibits no edema or tenderness.  Neurological: She is alert.  Skin: Skin is warm and dry.  Psychiatric: She has a normal mood and affect. Her behavior is  normal. Thought content normal.  Nursing note and vitals reviewed.   ED Course  Procedures (including critical care time)  9:19 PM-Discussed treatment plan which includes ultrasound, CT scan and labs with pt at bedside and pt agreed to plan.   Labs Review Labs Reviewed  CBC WITH DIFFERENTIAL/PLATELET - Abnormal; Notable for the following:    WBC 11.5 (*)    Neutro Abs 8.2 (*)    All other components within normal limits  COMPREHENSIVE METABOLIC PANEL - Abnormal; Notable for the following:    Glucose, Bld 119 (*)    GFR calc non Af Amer 78 (*)    All other components within normal limits  LIPASE, BLOOD  URINALYSIS, ROUTINE W REFLEX MICROSCOPIC    Imaging Review Ct Abdomen Pelvis W Contrast  02/10/2015   CLINICAL DATA:  Acute onset of right-sided abdominal pain for 4 days. Nausea. Initial encounter.  EXAM: CT ABDOMEN AND PELVIS WITH CONTRAST  TECHNIQUE: Multidetector CT imaging of the abdomen and pelvis was performed using the standard protocol following bolus administration of intravenous contrast.  CONTRAST:  OMNIPAQUE IOHEXOL 300 MG/ML  SOLN  COMPARISON:  None.  FINDINGS: The visualized lung bases are clear.  Diffuse gallbladder wall thickening is noted, with several associated stones at the base of the gallbladder, and mild surrounding soft tissue inflammation. Findings are compatible with acute cholecystitis. A few mildly prominent nodes about the head of the pancreas may be related to the acute gallbladder process.  The liver and spleen are unremarkable in appearance. No intrahepatic biliary ductal dilatation is seen. The pancreas and adrenal glands are unremarkable.  There is minimal left-sided hydronephrosis, with slightly asymmetric prominence of the left ureter. Mild right-sided pelvicaliectasis is noted. This is of uncertain significance, as there is no evidence of distal obstruction. This may simply be transient in nature. No renal or ureteral stones are identified. No  perinephric stranding is seen.  No free fluid is identified. The small bowel is unremarkable in appearance. The stomach is within normal limits. No acute vascular abnormalities are seen.  The patient is status post resection of much of the colon. The ileocolic anastomosis at the level of the sigmoid colon is unremarkable in appearance.  The bladder is mildly distended and grossly unremarkable. The uterus is unremarkable in appearance. The ovaries are relatively symmetric. No suspicious adnexal masses are seen. No inguinal lymphadenopathy is seen.  No acute osseous abnormalities are identified. Vacuum phenomenon is noted at L5-S1, with underlying facet disease.  IMPRESSION: 1. Acute cholecystitis, with diffuse gallbladder wall thickening, several stones at the base of the gallbladder, and mild surrounding soft tissue inflammation. Few mildly prominent nodes about the head of the pancreas may be related to the acute gallbladder process. 2. Minimal  left-sided hydronephrosis, without evidence of distal obstruction. This may simply be transient in nature.   Electronically Signed   By: Roanna RaiderJeffery  Chang M.D.   On: 02/10/2015 23:11     EKG Interpretation None      MDM   Final diagnoses:  Acute cholecystitis    Patient with abdominal pain. Has been diffuse or last couple days with some distention. Decreased appetite. Has had nausea. CT scan done shows acute cholecystitis. Patient still has moderate tenderness. [facility] Recommends transfer to the ER where they will see her. I personally performed the services described in this documentation, which was scribed in my presence. The recorded information has been reviewed and is accurate.    Benjiman CoreNathan Faye Strohman, MD 02/11/15 0002

## 2015-02-10 NOTE — ED Notes (Signed)
Lower abdominal pain 4 days ago. Pain radiated into her back and chest. Today pain came back in her right flank.

## 2015-02-11 ENCOUNTER — Encounter (HOSPITAL_COMMUNITY): Payer: Self-pay

## 2015-02-11 ENCOUNTER — Inpatient Hospital Stay (HOSPITAL_COMMUNITY): Payer: PRIVATE HEALTH INSURANCE

## 2015-02-11 DIAGNOSIS — K8000 Calculus of gallbladder with acute cholecystitis without obstruction: Secondary | ICD-10-CM | POA: Diagnosis present

## 2015-02-11 DIAGNOSIS — Z803 Family history of malignant neoplasm of breast: Secondary | ICD-10-CM | POA: Diagnosis not present

## 2015-02-11 DIAGNOSIS — R1011 Right upper quadrant pain: Secondary | ICD-10-CM | POA: Diagnosis present

## 2015-02-11 DIAGNOSIS — Z791 Long term (current) use of non-steroidal anti-inflammatories (NSAID): Secondary | ICD-10-CM | POA: Diagnosis not present

## 2015-02-11 DIAGNOSIS — K810 Acute cholecystitis: Secondary | ICD-10-CM | POA: Diagnosis present

## 2015-02-11 DIAGNOSIS — K219 Gastro-esophageal reflux disease without esophagitis: Secondary | ICD-10-CM | POA: Diagnosis present

## 2015-02-11 DIAGNOSIS — Z79899 Other long term (current) drug therapy: Secondary | ICD-10-CM | POA: Diagnosis not present

## 2015-02-11 DIAGNOSIS — Z79891 Long term (current) use of opiate analgesic: Secondary | ICD-10-CM | POA: Diagnosis not present

## 2015-02-11 DIAGNOSIS — Z7982 Long term (current) use of aspirin: Secondary | ICD-10-CM | POA: Diagnosis not present

## 2015-02-11 DIAGNOSIS — Z7952 Long term (current) use of systemic steroids: Secondary | ICD-10-CM | POA: Diagnosis not present

## 2015-02-11 DIAGNOSIS — K5190 Ulcerative colitis, unspecified, without complications: Secondary | ICD-10-CM | POA: Diagnosis present

## 2015-02-11 DIAGNOSIS — I10 Essential (primary) hypertension: Secondary | ICD-10-CM | POA: Diagnosis present

## 2015-02-11 LAB — PROTIME-INR
INR: 1.12 (ref 0.00–1.49)
Prothrombin Time: 14.5 seconds (ref 11.6–15.2)

## 2015-02-11 LAB — URINALYSIS, ROUTINE W REFLEX MICROSCOPIC
BILIRUBIN URINE: NEGATIVE
Glucose, UA: NEGATIVE mg/dL
HGB URINE DIPSTICK: NEGATIVE
Ketones, ur: NEGATIVE mg/dL
Nitrite: NEGATIVE
Protein, ur: NEGATIVE mg/dL
SPECIFIC GRAVITY, URINE: 1.027 (ref 1.005–1.030)
UROBILINOGEN UA: 0.2 mg/dL (ref 0.0–1.0)
pH: 5.5 (ref 5.0–8.0)

## 2015-02-11 LAB — APTT: aPTT: 37 seconds (ref 24–37)

## 2015-02-11 LAB — URINE MICROSCOPIC-ADD ON

## 2015-02-11 MED ORDER — ONDANSETRON HCL 4 MG/2ML IJ SOLN: 4.0000 mg | Freq: Four times a day (QID) | INTRAMUSCULAR | Status: DC | PRN

## 2015-02-11 MED ORDER — PANTOPRAZOLE SODIUM 40 MG IV SOLR
40.0000 mg | Freq: Every day | INTRAVENOUS | Status: DC
  Administered 2015-02-11 – 2015-02-12 (×2): 40 mg via INTRAVENOUS
  Filled 2015-02-11 (×3): qty 40

## 2015-02-11 MED ORDER — PIPERACILLIN-TAZOBACTAM 3.375 G IVPB 30 MIN
3.3750 g | Freq: Once | INTRAVENOUS | Status: AC
  Administered 2015-02-11: 3.375 g via INTRAVENOUS
  Filled 2015-02-11: qty 50

## 2015-02-11 MED ORDER — SODIUM CHLORIDE 0.9 % IV SOLN
3.0000 g | Freq: Once | INTRAVENOUS | Status: DC
  Filled 2015-02-11: qty 3

## 2015-02-11 MED ORDER — HYDROMORPHONE HCL 1 MG/ML IJ SOLN
0.5000 mg | INTRAMUSCULAR | Status: DC | PRN
  Administered 2015-02-11: 1 mg via INTRAVENOUS
  Administered 2015-02-11: 0.5 mg via INTRAVENOUS
  Administered 2015-02-11 – 2015-02-12 (×6): 1 mg via INTRAVENOUS
  Filled 2015-02-11 (×8): qty 1

## 2015-02-11 MED ORDER — HEPARIN SODIUM (PORCINE) 5000 UNIT/ML IJ SOLN
5000.0000 [IU] | Freq: Three times a day (TID) | INTRAMUSCULAR | Status: DC
  Administered 2015-02-11 – 2015-02-13 (×5): 5000 [IU] via SUBCUTANEOUS
  Filled 2015-02-11 (×8): qty 1

## 2015-02-11 MED ORDER — DIPHENHYDRAMINE HCL 12.5 MG/5ML PO ELIX
12.5000 mg | ORAL_SOLUTION | Freq: Four times a day (QID) | ORAL | Status: DC | PRN
  Administered 2015-02-12: 12.5 mg via ORAL
  Filled 2015-02-11: qty 5

## 2015-02-11 MED ORDER — ONDANSETRON HCL 4 MG/2ML IJ SOLN: 4.0000 mg | Freq: Three times a day (TID) | INTRAMUSCULAR | Status: DC | PRN

## 2015-02-11 MED ORDER — MIDAZOLAM HCL 2 MG/2ML IJ SOLN
INTRAMUSCULAR | Status: AC | PRN
  Administered 2015-02-11: 0.5 mg via INTRAVENOUS
  Administered 2015-02-11: 1 mg via INTRAVENOUS
  Administered 2015-02-11 (×5): 0.5 mg via INTRAVENOUS

## 2015-02-11 MED ORDER — PIPERACILLIN-TAZOBACTAM 3.375 G IVPB
3.3750 g | Freq: Three times a day (TID) | INTRAVENOUS | Status: DC
  Administered 2015-02-11 – 2015-02-13 (×6): 3.375 g via INTRAVENOUS
  Filled 2015-02-11 (×7): qty 50

## 2015-02-11 MED ORDER — FENTANYL CITRATE (PF) 100 MCG/2ML IJ SOLN
INTRAMUSCULAR | Status: AC
  Filled 2015-02-11: qty 4

## 2015-02-11 MED ORDER — POTASSIUM CHLORIDE IN NACL 20-0.9 MEQ/L-% IV SOLN
INTRAVENOUS | Status: DC
  Administered 2015-02-11 – 2015-02-12 (×3): via INTRAVENOUS
  Filled 2015-02-11 (×8): qty 1000

## 2015-02-11 MED ORDER — HYDROMORPHONE HCL 1 MG/ML IJ SOLN: 0.5000 mg | INTRAMUSCULAR | Status: DC | PRN

## 2015-02-11 MED ORDER — LIDOCAINE HCL 1 % IJ SOLN
INTRAMUSCULAR | Status: AC
  Filled 2015-02-11: qty 20

## 2015-02-11 MED ORDER — MIDAZOLAM HCL 2 MG/2ML IJ SOLN
INTRAMUSCULAR | Status: AC
  Filled 2015-02-11: qty 6

## 2015-02-11 MED ORDER — HYDROMORPHONE HCL 1 MG/ML IJ SOLN
0.5000 mg | Freq: Once | INTRAMUSCULAR | Status: AC
  Administered 2015-02-11: 0.5 mg via INTRAVENOUS
  Filled 2015-02-11: qty 1

## 2015-02-11 MED ORDER — IOHEXOL 300 MG/ML  SOLN
20.0000 mL | Freq: Once | INTRAMUSCULAR | Status: AC | PRN
  Administered 2015-02-11: 10 mL

## 2015-02-11 MED ORDER — ACETAMINOPHEN 325 MG PO TABS
650.0000 mg | ORAL_TABLET | Freq: Four times a day (QID) | ORAL | Status: DC | PRN
  Administered 2015-02-12: 650 mg via ORAL
  Filled 2015-02-11: qty 2

## 2015-02-11 MED ORDER — ONDANSETRON HCL 4 MG/2ML IJ SOLN
4.0000 mg | Freq: Once | INTRAMUSCULAR | Status: AC
  Administered 2015-02-11: 4 mg via INTRAVENOUS
  Filled 2015-02-11: qty 2

## 2015-02-11 MED ORDER — ACETAMINOPHEN 650 MG RE SUPP: 650.0000 mg | Freq: Four times a day (QID) | RECTAL | Status: DC | PRN

## 2015-02-11 MED ORDER — FENTANYL CITRATE (PF) 100 MCG/2ML IJ SOLN
INTRAMUSCULAR | Status: AC | PRN
  Administered 2015-02-11: 50 ug via INTRAVENOUS
  Administered 2015-02-11: 25 ug via INTRAVENOUS

## 2015-02-11 MED ORDER — DIPHENHYDRAMINE HCL 50 MG/ML IJ SOLN
12.5000 mg | Freq: Four times a day (QID) | INTRAMUSCULAR | Status: DC | PRN
Start: 2015-02-11 — End: 2015-02-13
  Administered 2015-02-11: 12.5 mg via INTRAVENOUS
  Filled 2015-02-11: qty 1

## 2015-02-11 MED ORDER — SODIUM CHLORIDE 0.9 % IV SOLN
INTRAVENOUS | Status: AC
  Administered 2015-02-11: 02:00:00 via INTRAVENOUS

## 2015-02-11 NOTE — H&P (Signed)
Terri [name] is an 64 y.o. female.   Chief Complaint: abdominal and chest pain going to her back for 5 days. HPI: Pt reports onset of pain starting 4/22, her stomach was distended and had some pain going to her chest; she related it to something she ate. Pain has persisted and she just lived with it.  She presented to the ED last PM in [place]. Pain in ED reported in the right flank, she has a history of ulcerative colitis where her appendix and colon were removed.   Work up in [employer] shows she is afebrile, a little tachycardic, BP stable.  CMP OK, no pancreatitis or abnormal LFT's.  CT scan shows acute cholecystitis with diffuse gallbladder wall thickening several stones at the base of the gallbladder, and mild surroundingsoft tissue inflammation. Few mildly prominent nodes about the head of the pancreas may be related to the acute gallbladder process. 2. Minimal left-sided hydronephrosis, without evidence of distal obstruction. This may simply be transient in nature. She was transferred to [facility] from [facility] for further evaluation and treatment.  Past Medical History  Diagnosis Date  Ulcerative colitis with 3 surgeries, almost total colectomy, now with J pouch   Chronic diarrhea (loose stools)   GERD   BMI 39   Hypertension   Seizure - none since age 61, no meds, no etiology      Past Surgical History  Procedure Laterality Date  . Colon surgery Almost total colectomy, reversal of ileostomy and J pouch creation    . Appendectomy      Family History  Problem Relation Age of Onset  . Breast cancer Sister   . Rheum arthritis Sister    Social History:  reports that she has never smoked. She has never used smokeless tobacco. She reports that she does not drink alcohol or use illicit drugs. 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 Authorizing Provider  Cholecalciferol (VITAMIN D PO) Take 1 tablet by mouth daily. Not taking  Yes Historical Provider, MD  esomeprazole (NEXIUM) 40 MG capsule Take 40 mg by mouth daily at 12 noon.   Yes Historical Provider, MD  naproxen sodium (ANAPROX) 220 MG tablet Take 440 mg by mouth 2 (two) times daily as needed (pain). Not taking  Yes Historical Provider, MD  aspirin 81 MG tablet Take 81 mg by mouth at bedtime.     Historical Provider, MD  benzonatate (TESSALON) 200 MG capsule One every 4 hours as needed 09/04/14 Not taking  Tanda Rockers, MD  famotidine (PEPCID) 20 MG tablet One at bedtime 07/25/14 Not taking  Tanda Rockers, MD  nitroGLYCERIN (NITROSTAT) 0.4 MG SL tablet Place 0.4 mg under the tongue every 5 (five) minutes as needed for chest pain.  Has never used this  Historical Provider, MD  olmesartan-hydrochlorothiazide (BENICAR HCT) 20-12.5 MG per tablet Take 1 tablet by mouth daily. Patient taking differently: Take 1 tablet by mouth at bedtime.  08/07/14   Tanda Rockers, MD  pantoprazole (PROTONIX) 40 MG tablet Take 1 tablet (40 mg total) by mouth daily. Take 30-60 min before first meal of the day 07/25/14 Not taking  Tanda Rockers, MD  predniSONE (DELTASONE) 10 MG tablet Take  4 each am x 2 days,   2 each am x 2  days,  1 each am x 2 days and stop 09/04/14 Not taking  Tanda Rockers, MD  traMADol Veatrice Bourbon) 50 MG tablet 1-2 every 4 hours as needed for cough or pain 09/04/14 Not taking  Tanda Rockers, MD     Results for orders placed or performed during the hospital encounter of 02/10/15 (from the past 48 hour(s))  CBC with Differential     Status: Abnormal   Collection Time: 02/10/15  9:52 PM  Result Value Ref Range   WBC 11.5 (H) 4.0 - 10.5 K/uL   RBC 4.36 3.87 - 5.11 MIL/uL   Hemoglobin 12.7 12.0 - 15.0 g/dL   HCT 38.8 36.0 - 46.0 %   MCV 89.0 78.0 - 100.0 fL   MCH 29.1 26.0 - 34.0 pg   MCHC 32.7 30.0 - 36.0 g/dL   RDW 12.8 11.5 - 15.5 %    Platelets 186 150 - 400 K/uL   Neutrophils Relative % 71 43 - 77 %   Neutro Abs 8.2 (H) 1.7 - 7.7 K/uL   Lymphocytes Relative 21 12 - 46 %   Lymphs Abs 2.4 0.7 - 4.0 K/uL   Monocytes Relative 7 3 - 12 %   Monocytes Absolute 0.8 0.1 - 1.0 K/uL   Eosinophils Relative 1 0 - 5 %   Eosinophils Absolute 0.1 0.0 - 0.7 K/uL   Basophils Relative 0 0 - 1 %   Basophils Absolute 0.0 0.0 - 0.1 K/uL  Comprehensive metabolic panel     Status: Abnormal   Collection Time: 02/10/15  9:52 PM  Result Value Ref Range   Sodium 135 135 - 145 mmol/L   Potassium 4.0 3.5 - 5.1 mmol/L   Chloride 100 96 - 112 mmol/L   CO2 27 19 - 32 mmol/L   Glucose, Bld 119 (H) 70 - 99 mg/dL   BUN 18 6 - 23 mg/dL   Creatinine, Ser 0.80 0.50 - 1.10 mg/dL   Calcium 9.2 8.4 - 10.5 mg/dL   Total Protein 7.5 6.0 - 8.3 g/dL   Albumin 4.0 3.5 - 5.2 g/dL   AST 34 0 - 37 U/L   ALT 32 0 - 35 U/L   Alkaline Phosphatase 78 39 - 117 U/L   Total Bilirubin 1.1 0.3 - 1.2 mg/dL   GFR calc non Af Amer 78 (L) >90 mL/min   GFR calc Af Amer >90 >90 mL/min    Comment: (NOTE) The eGFR has been calculated using the CKD EPI equation. This calculation has not been validated in all clinical situations. eGFR's persistently <90 mL/min signify possible Chronic Kidney Disease.    Anion gap 8 5 - 15  Lipase, blood     Status: None   Collection Time: 02/10/15  9:52 PM  Result Value Ref Range   Lipase 21 11 - 59 U/L  Urinalysis, Routine w reflex microscopic     Status: Abnormal   Collection Time: 02/11/15 12:42 AM  Result Value Ref Range   Color, Urine AMBER (A) YELLOW    Comment: BIOCHEMICALS MAY BE AFFECTED BY COLOR   APPearance CLOUDY (A) CLEAR   Specific Gravity, Urine 1.027 1.005 - 1.030   pH 5.5 5.0 - 8.0   Glucose, UA NEGATIVE NEGATIVE mg/dL   Hgb urine dipstick NEGATIVE NEGATIVE   Bilirubin Urine NEGATIVE NEGATIVE   Ketones, ur NEGATIVE NEGATIVE mg/dL   Protein, ur NEGATIVE NEGATIVE mg/dL   Urobilinogen, UA 0.2 0.0 - 1.0 mg/dL    Nitrite NEGATIVE NEGATIVE  Leukocytes, UA MODERATE (A) NEGATIVE  Urine microscopic-add on     Status: Abnormal   Collection Time: 02/11/15 12:42 AM  Result Value Ref Range   Squamous Epithelial / LPF FEW (A) RARE   WBC, UA 7-10 <3 WBC/hpf   RBC / HPF 0-2 <3 RBC/hpf   Bacteria, UA MANY (A) RARE   Urine-Other MICROSCOPIC EXAM PERFORMED ON UNCONCENTRATED URINE    Ct Abdomen Pelvis W Contrast  02/10/2015   CLINICAL DATA:  Acute onset of right-sided abdominal pain for 4 days. Nausea. Initial encounter.  EXAM: CT ABDOMEN AND PELVIS WITH CONTRAST  TECHNIQUE: Multidetector CT imaging of the abdomen and pelvis was performed using the standard protocol following bolus administration of intravenous contrast.  CONTRAST:  124m OMNIPAQUE IOHEXOL 300 MG/ML  SOLN  COMPARISON:  None.  FINDINGS: The visualized lung bases are clear.  Diffuse gallbladder wall thickening is noted, with several associated stones at the base of the gallbladder, and mild surrounding soft tissue inflammation. Findings are compatible with acute cholecystitis. A few mildly prominent nodes about the head of the pancreas may be related to the acute gallbladder process.  The liver and spleen are unremarkable in appearance. No intrahepatic biliary ductal dilatation is seen. The pancreas and adrenal glands are unremarkable.  There is minimal left-sided hydronephrosis, with slightly asymmetric prominence of the left ureter. Mild right-sided pelvicaliectasis is noted. This is of uncertain significance, as there is no evidence of distal obstruction. This may simply be transient in nature. No renal or ureteral stones are identified. No perinephric stranding is seen.  No free fluid is identified. The small bowel is unremarkable in appearance. The stomach is within normal limits. No acute vascular abnormalities are seen.  The patient is status post resection of much of the colon. The ileocolic anastomosis at the level of the sigmoid colon is unremarkable  in appearance.  The bladder is mildly distended and grossly unremarkable. The uterus is unremarkable in appearance. The ovaries are relatively symmetric. No suspicious adnexal masses are seen. No inguinal lymphadenopathy is seen.  No acute osseous abnormalities are identified. Vacuum phenomenon is noted at L5-S1, with underlying facet disease.  IMPRESSION: 1. Acute cholecystitis, with diffuse gallbladder wall thickening, several stones at the base of the gallbladder, and mild surrounding soft tissue inflammation. Few mildly prominent nodes about the head of the pancreas may be related to the acute gallbladder process. 2. Minimal left-sided hydronephrosis, without evidence of distal obstruction. This may simply be transient in nature.   Electronically Signed   By: JGarald BaldingM.D.   On: 02/10/2015 23:11    Review of Systems  Constitutional: Negative for fever, chills, weight loss, malaise/fatigue and diaphoresis.  HENT: Negative.   Eyes: Negative.   Respiratory: Positive for cough (for 2 years). Negative for hemoptysis, sputum production, shortness of breath and wheezing.   Cardiovascular: Negative.   Gastrointestinal: Positive for heartburn (on PPI), nausea, abdominal pain (currently all in RUQ, she thought it was chest pain at first, and it would go to her back.) and diarrhea (She has had most of colon removed, with J pouch and has loose stools 10-12 times per day.). Negative for vomiting, constipation, blood in stool and melena.  Genitourinary: Negative.   Musculoskeletal:       She has hand pain.  Skin: Negative.   Neurological: Negative.  Negative for weakness.  Endo/Heme/Allergies: Negative.   Psychiatric/Behavioral: Negative.     Blood pressure 117/60, pulse 102, temperature 98.4 F (36.9 C), temperature source Oral, resp.  rate 16, height _0  (1.626 m), weight 102.967 kg (227 lb), SpO2 97 %. Physical Exam  Constitutional: She is oriented to person, place, and time. She appears  well-developed and well-nourished.  HENT:  Head: Normocephalic and atraumatic.  Nose: Nose normal.  Eyes: Conjunctivae and EOM are normal. Pupils are equal, round, and reactive to light. Right eye exhibits no discharge. Left eye exhibits no discharge. No scleral icterus.  Neck: Normal range of motion. Neck supple. No JVD present. No tracheal deviation present. No thyromegaly present.  Cardiovascular: Normal rate, regular rhythm, normal heart sounds and intact distal pulses.   No murmur heard. Respiratory: Effort normal and breath sounds normal. No respiratory distress. She has no wheezes. She has no rales. She exhibits no tenderness.  GI: Soft. She exhibits no distension and no mass. There is tenderness (RUQ). There is no rebound and no guarding.  Musculoskeletal: She exhibits no edema or tenderness.  Lymphadenopathy:    She has no cervical adenopathy.  Neurological: She is alert and oriented to person, place, and time. No cranial nerve deficit.  Skin: Skin is warm and dry. No rash noted. No erythema. No pallor.  Psychiatric: She has a normal mood and affect. Her behavior is normal. Judgment and thought content normal.     Assessment/Plan Acute cholecystitis with cholelithiasis Ulcerative colitis with almost total colectomy, J Pouch GERD Hypertension BMI 39  Plan:  IV antibiotics, drain placement, hydration, and later interval cholecystectomy   Falen Lehrmann 02/11/2015, 6:53 AM

## 2015-02-11 NOTE — ED Provider Notes (Addendum)
[facility], to see general surgeon.  General surgeon paged. Pt alert, content. Pain controlled. Afeb. Has recd iv abx.   Discussed pt with Dr Daphine DeutscherMartin - he requests temp admit orders, he will see.     Cathren LaineKevin Pete Schnitzer, MD 02/11/15 [phone]

## 2015-02-11 NOTE — ED Notes (Signed)
Called to give Terri StraussWesley Long Charge RN [employer] Charge Report and she said she will get report from [facility].  Informed RN that Pt. Has had no pain meds and is in no distress.

## 2015-02-11 NOTE — H&P (Signed)
Reason for consult: Percutaneous cholecystostomy  Referring Physician(s): CCS  History of Present Illness: Terri [name] is a 64 y.o. female with 4-5 day history of intermittent  abdominal pain (primarily right upper quadrant) with radiation to chest and right flank region, nausea, diarrhea. Subsequent CT of the abdomen and pelvis on 02/10/15 revealed acute cholecystitis with diffuse gallbladder wall thickening, several stones at the base of gallbladder and mild surrounding soft tissue inflammation. There is also minimal left-sided hydronephrosis without evidence of distal obstruction. Patient does have a history of ulcerative colitis where her appendix and colon were removed [facility]) with subsequent J pouch. Patient was seen by surgery and they feel that dense inflammatory adhesions noted would increase the potential complications of cholecystectomy at this time. Request is now received for percutaneous cholecystostomy.  Past Medical History  Diagnosis Date  . Hypertension   . Seizure   . Complication of anesthesia     STAYS SLEEPY FROM ANESTHESIA    Past Surgical History  Procedure Laterality Date  . Colon surgery    . Appendectomy      Allergies: Review of patient's allergies indicates no known allergies.  Medications: Prior to Admission medications   Medication Sig Start Date End Date Taking? Authorizing Provider  Cholecalciferol (VITAMIN D PO) Take 1 tablet by mouth daily.   Yes Historical Provider, MD  esomeprazole (NEXIUM) 40 MG capsule Take 40 mg by mouth daily at 12 noon.   Yes Historical Provider, MD  naproxen sodium (ANAPROX) 220 MG tablet Take 440 mg by mouth 2 (two) times daily as needed (pain).   Yes Historical Provider, MD  aspirin 81 MG tablet Take 81 mg by mouth at bedtime.     Historical Provider, MD  benzonatate (TESSALON) 200 MG capsule One every 4 hours as needed 09/04/14   Nyoka Cowden, MD  famotidine (PEPCID) 20 MG tablet One at bedtime 07/25/14    Nyoka Cowden, MD  nitroGLYCERIN (NITROSTAT) 0.4 MG SL tablet Place 0.4 mg under the tongue every 5 (five) minutes as needed for chest pain.    Historical Provider, MD  olmesartan-hydrochlorothiazide (BENICAR HCT) 20-12.5 MG per tablet Take 1 tablet by mouth daily. Patient taking differently: Take 1 tablet by mouth at bedtime.  08/07/14   Nyoka Cowden, MD  pantoprazole (PROTONIX) 40 MG tablet Take 1 tablet (40 mg total) by mouth daily. Take 30-60 min before first meal of the day 07/25/14   Nyoka Cowden, MD  predniSONE (DELTASONE) 10 MG tablet Take  4 each am x 2 days,   2 each am x 2 days,  1 each am x 2 days and stop 09/04/14   Nyoka Cowden, MD  traMADol Janean Sark) 50 MG tablet 1-2 every 4 hours as needed for cough or pain 09/04/14   Nyoka Cowden, MD     Family History  Problem Relation Age of Onset  . Breast cancer Sister   . Rheum arthritis Sister     History   Social History  . Marital Status: Single    Spouse Name: N/A  . Number of Children: N/A  . Years of Education: N/A   Occupational History  . Office Admin    Social History Main Topics  . Smoking status: Never Smoker   . Smokeless tobacco: Never Used  . Alcohol Use: No  . Drug Use: No  . Sexual Activity: No   Other Topics Concern  . None   Social History Narrative  Review of Systems patient currently denies fevers, dyspnea, cough, nausea/ vomiting, chest pain at this time. Only significant complaint is right upper quadrant discomfort.  Vital Signs: BP 105/57 mmHg  Pulse 87  Temp(Src) 98 F (36.7 C) (Oral)  Resp 16  Ht 5\' 4"  (1.626 m)  Wt 227 lb (102.967 kg)  BMI 38.95 kg/m2  SpO2 96%  Physical Exam patient awake, alert. Chest clear to auscultation bilaterally. Abdomen soft, positive bowel sounds, mildly tender right upper quadrant; clean midline surgical scars; extremities with full range of motion.  Mallampati Score:     Imaging: Ct Abdomen Pelvis W Contrast  02/10/2015   CLINICAL  DATA:  Acute onset of right-sided abdominal pain for 4 days. Nausea. Initial encounter.  EXAM: CT ABDOMEN AND PELVIS WITH CONTRAST  TECHNIQUE: Multidetector CT imaging of the abdomen and pelvis was performed using the standard protocol following bolus administration of intravenous contrast.  CONTRAST:  100mL OMNIPAQUE IOHEXOL 300 MG/ML  SOLN  COMPARISON:  None.  FINDINGS: The visualized lung bases are clear.  Diffuse gallbladder wall thickening is noted, with several associated stones at the base of the gallbladder, and mild surrounding soft tissue inflammation. Findings are compatible with acute cholecystitis. A few mildly prominent nodes about the head of the pancreas may be related to the acute gallbladder process.  The liver and spleen are unremarkable in appearance. No intrahepatic biliary ductal dilatation is seen. The pancreas and adrenal glands are unremarkable.  There is minimal left-sided hydronephrosis, with slightly asymmetric prominence of the left ureter. Mild right-sided pelvicaliectasis is noted. This is of uncertain significance, as there is no evidence of distal obstruction. This may simply be transient in nature. No renal or ureteral stones are identified. No perinephric stranding is seen.  No free fluid is identified. The small bowel is unremarkable in appearance. The stomach is within normal limits. No acute vascular abnormalities are seen.  The patient is status post resection of much of the colon. The ileocolic anastomosis at the level of the sigmoid colon is unremarkable in appearance.  The bladder is mildly distended and grossly unremarkable. The uterus is unremarkable in appearance. The ovaries are relatively symmetric. No suspicious adnexal masses are seen. No inguinal lymphadenopathy is seen.  No acute osseous abnormalities are identified. Vacuum phenomenon is noted at L5-S1, with underlying facet disease.  IMPRESSION: 1. Acute cholecystitis, with diffuse gallbladder wall thickening,  several stones at the base of the gallbladder, and mild surrounding soft tissue inflammation. Few mildly prominent nodes about the head of the pancreas may be related to the acute gallbladder process. 2. Minimal left-sided hydronephrosis, without evidence of distal obstruction. This may simply be transient in nature.   Electronically Signed   By: Roanna RaiderJeffery  Chang M.D.   On: 02/10/2015 23:11    Labs:  CBC:  Recent Labs  09/04/14 1023 02/10/15 2152  WBC 7.8 11.5*  HGB 13.5 12.7  HCT 40.9 38.8  PLT 178.0 186    COAGS: No results for input(s): INR, APTT in the last 8760 hours.  BMP:  Recent Labs  02/10/15 2152  NA 135  K 4.0  CL 100  CO2 27  GLUCOSE 119*  BUN 18  CALCIUM 9.2  CREATININE 0.80  GFRNONAA 78*  GFRAA >90    LIVER FUNCTION TESTS:  Recent Labs  02/10/15 2152  BILITOT 1.1  AST 34  ALT 32  ALKPHOS 78  PROT 7.5  ALBUMIN 4.0    TUMOR MARKERS: No results for input(s): AFPTM, CEA, CA199, CHROMGRNA  in the last 8760 hours.  Assessment and Plan: CASIMIRA SUTPHIN is a 64 y.o. female with 4-5 day history of intermittent  abdominal pain (primarily right upper quadrant) with radiation to chest and right flank region, nausea, diarrhea. Subsequent CT of the abdomen and pelvis on 02/10/15 revealed acute cholecystitis with diffuse gallbladder wall thickening, several stones at the base of gallbladder and mild surrounding soft tissue inflammation. There is also minimal left-sided hydronephrosis without evidence of distal obstruction. Patient does have a history of ulcerative colitis where her appendix and colon were removed [facility]) with subsequent J pouch. Patient was seen by surgery and they feel that dense inflammatory adhesions noted would increase the potential complications of cholecystectomy at this time. Request is now received for percutaneous cholecystostomy. Patient is currently afebrile with white count of 11.5 and normal LFTs. Imaging studies have been reviewed by Dr.  Bonnielee Haff. Details/risks of procedure, including but not limited to, internal bleeding, infection, need for emergent surgery, discussed with patient with her understanding and consent. Procedure planned for later today.    Signed: Chinita Pester 02/11/2015, 10:49 AM   I spent a total of 20 minutes in face to face in clinical consultation, greater than 50% of which was counseling/coordinating care for percutaneous cholecystostomy

## 2015-02-11 NOTE — Progress Notes (Signed)
ANTIBIOTIC CONSULT NOTE - INITIAL  Pharmacy Consult for Zosyn Indication: cholecystitis  No Known Allergies  Patient Measurements: Height: 5\' 4"  (162.6 cm) Weight: 227 lb (102.967 kg) IBW/kg (Calculated) : 54.7  Vital Signs: Temp: 97.9 F (36.6 C) (04/27 0755) Temp Source: Oral (04/27 0755) BP: 126/76 mmHg (04/27 0755) Pulse Rate: 83 (04/27 0755) Intake/Output from previous day:   Intake/Output from this shift:    Labs:  Recent Labs  02/10/15 2152  WBC 11.5*  HGB 12.7  PLT 186  CREATININE 0.80   Estimated Creatinine Clearance: 86.3 mL/min (by C-G formula based on Cr of 0.8). No results for input(s): VANCOTROUGH, VANCOPEAK, VANCORANDOM, GENTTROUGH, GENTPEAK, GENTRANDOM, TOBRATROUGH, TOBRAPEAK, TOBRARND, AMIKACINPEAK, AMIKACINTROU, AMIKACIN in the last 72 hours.   Microbiology: No results found for this or any previous visit (from the past 720 hour(s)).  Medical History: Past Medical History  Diagnosis Date  . Hypertension   . Seizure   . Complication of anesthesia     STAYS SLEEPY FROM ANESTHESIA    Medications:  Scheduled:  . fentaNYL (SUBLIMAZE) injection  50 mcg Intravenous Once  . heparin  5,000 Units Subcutaneous 3 times per day  . pantoprazole (PROTONIX) IV  40 mg Intravenous QHS   Infusions:  . sodium chloride 150 mL/hr at 02/11/15 0151  . 0.9 % NaCl with KCl 20 mEq / L    . piperacillin-tazobactam     PRN: acetaminophen **OR** acetaminophen, diphenhydrAMINE **OR** diphenhydrAMINE, HYDROmorphone (DILAUDID) injection, ondansetron Anti-infectives    Start     Dose/Rate Route Frequency Ordered Stop   02/11/15 0830  piperacillin-tazobactam (ZOSYN) IVPB 3.375 g     3.375 g 100 mL/hr over 30 Minutes Intravenous  Once 02/11/15 0825     02/11/15 0730  Ampicillin-Sulbactam (UNASYN) 3 g in sodium chloride 0.9 % 100 mL IVPB  Status:  Discontinued     3 g 100 mL/hr over 60 Minutes Intravenous  Once 02/11/15 0723 02/11/15 0817   02/11/15 0000   Ampicillin-Sulbactam (UNASYN) 3 g in sodium chloride 0.9 % 100 mL IVPB     3 g 100 mL/hr over 60 Minutes Intravenous  Once 02/10/15 2359 02/11/15 0120     Assessment 63yoF with PMH ulcerative colitis and prior appendectomy/colectomy presented yesterday PM to [facility] with abd/chest pain x 5d.  CTabd shows acute cholecystitis.  Given Unasyn 3 g IV x 1 and [facility]  Surgery anticipates inflammatory adhesions and will proceed with IV abx and cholecystostomy for now; to begin Zosyn per pharmacy.  Plan is for cholecystectomy in 6-8 wks.  4/27 >> Zosyn >>  Tmax: afebrile WBCs: sl elevated Renal: SCr 0.8; CrCl 86  No Cx ordered  Drug level / dose changes info:   Goal of Therapy:   Eradication of infection  Appropriate antibiotic dosing for indication and renal function  Plan:  Day 1 of antibiotics Zosyn 3.375 g IV given once over 30 minutes, then every 8 hrs by 4-hr infusion  Follow clinical course, renal function, culture results as available  Follow for de-escalation of antibiotics and LOT   Bernadene Personrew Chantavia Bazzle, PharmD Pager: [phone] 02/11/2015, 9:46 AM

## 2015-02-11 NOTE — Progress Notes (Signed)
UR completed 

## 2015-02-11 NOTE — ED Notes (Signed)
Bed: WA07 Expected date: 02/11/15 Expected time: 12:51 AM Means of arrival: Ambulance Comments: Transfer from [facility]  cholecystitis

## 2015-02-11 NOTE — Procedures (Signed)
Cholecystostomy Drain Cystic duct occluded No comp

## 2015-02-12 DIAGNOSIS — K8000 Calculus of gallbladder with acute cholecystitis without obstruction: Secondary | ICD-10-CM | POA: Insufficient documentation

## 2015-02-12 LAB — COMPREHENSIVE METABOLIC PANEL
ALBUMIN: 3.3 g/dL — AB (ref 3.5–5.2)
ALT: 31 U/L (ref 0–35)
ANION GAP: 8 (ref 5–15)
AST: 31 U/L (ref 0–37)
Alkaline Phosphatase: 71 U/L (ref 39–117)
BUN: 14 mg/dL (ref 6–23)
CALCIUM: 8.7 mg/dL (ref 8.4–10.5)
CO2: 25 mmol/L (ref 19–32)
CREATININE: 0.77 mg/dL (ref 0.50–1.10)
Chloride: 107 mmol/L (ref 96–112)
GFR calc non Af Amer: 89 mL/min — ABNORMAL LOW (ref >90)
GLUCOSE: 109 mg/dL — AB (ref 70–99)
Potassium: 4.1 mmol/L (ref 3.5–5.1)
Sodium: 140 mmol/L (ref 135–145)
TOTAL PROTEIN: 6.4 g/dL (ref 6.0–8.3)
Total Bilirubin: 0.7 mg/dL (ref 0.3–1.2)

## 2015-02-12 LAB — CBC
HCT: 35 % — ABNORMAL LOW (ref 36.0–46.0)
Hemoglobin: 10.8 g/dL — ABNORMAL LOW (ref 12.0–15.0)
MCH: 28.1 pg (ref 26.0–34.0)
MCHC: 30.9 g/dL (ref 30.0–36.0)
MCV: 90.9 fL (ref 78.0–100.0)
PLATELETS: 160 10*3/uL (ref 150–400)
RBC: 3.85 MIL/uL — AB (ref 3.87–5.11)
RDW: 13.1 % (ref 11.5–15.5)
WBC: 6.9 10*3/uL (ref 4.0–10.5)

## 2015-02-12 LAB — LIPASE, BLOOD: Lipase: 19 U/L (ref 11–59)

## 2015-02-12 NOTE — Progress Notes (Addendum)
Lab results from Gram stain given to Saks IncorporatedEmina Riebock, CCS. No new orders at this time

## 2015-02-12 NOTE — Progress Notes (Signed)
Patient ID: Terri [name], female   DOB: Jan 17, 1951, 64 y.o.   MRN: [id number]     [facility]      [address] [place], [place] [address]    Phone: [phone] FAX: [phone]     Subjective: Pain has greatly improved.  She ate her whole breakfast.  Having BMs.  Voiding.  Getting up in room.  VSS.  Afebrile.  WBC normal.  LFTs are normal.  Objective:  Vital signs:  Filed Vitals:   02/11/15 2007 02/11/15 2129 02/12/15 0207 02/12/15 0527  BP: 123/69 111/74 98/48 115/75  Pulse: 87 80 77 86  Temp: 98.8 F (37.1 C) 98.5 F (36.9 C) 98.6 F (37 C) 98.4 F (36.9 C)  TempSrc: Oral Oral Oral Oral  Resp: $Remo'16 16 16 16  'Scuso$ Height:      Weight:      SpO2: 97% 96% 96% 98%    Last BM Date: 02/11/15  Intake/Output   Yesterday:  04/27 0701 - 04/28 0700 In: 3383.8 [P.O.:840; I.V.:2493.8; IV Piggyback:50] Out: 9242 [Urine:1350; Drains:40; Stool:2] This shift: I/O last 3 completed shifts: In: 3383.8 [P.O.:840; I.V.:2493.8; IV Piggyback:50] Out: 6834 [Urine:1350; Drains:40; Stool:2]    Physical Exam: General: Pt awake/alert/oriented x4 in no acute distress Chest: cta No chest wall pain w good excursion CV:  Pulses intact.  Regular rhythm MS: Normal AROM mjr joints.  No obvious deformity Abdomen: Soft.  Nondistended.  minimall tender around the drain.  Out is bilious, serosang.  No evidence of peritonitis.  No incarcerated hernias. Ext:  SCDs BLE.  No mjr edema.  No cyanosis Skin: No petechiae / purpura   Problem List:   Active Problems:   Acute cholecystitis    Results:   Labs: Results for orders placed or performed during the hospital encounter of 02/10/15 (from the past 48 hour(s))  CBC with Differential     Status: Abnormal   Collection Time: 02/10/15  9:52 PM  Result Value Ref Range   WBC 11.5 (H) 4.0 - 10.5 K/uL   RBC 4.36 3.87 - 5.11 MIL/uL   Hemoglobin 12.7 12.0 - 15.0 g/dL   HCT 38.8 36.0 - 46.0 %   MCV 89.0  78.0 - 100.0 fL   MCH 29.1 26.0 - 34.0 pg   MCHC 32.7 30.0 - 36.0 g/dL   RDW 12.8 11.5 - 15.5 %   Platelets 186 150 - 400 K/uL   Neutrophils Relative % 71 43 - 77 %   Neutro Abs 8.2 (H) 1.7 - 7.7 K/uL   Lymphocytes Relative 21 12 - 46 %   Lymphs Abs 2.4 0.7 - 4.0 K/uL   Monocytes Relative 7 3 - 12 %   Monocytes Absolute 0.8 0.1 - 1.0 K/uL   Eosinophils Relative 1 0 - 5 %   Eosinophils Absolute 0.1 0.0 - 0.7 K/uL   Basophils Relative 0 0 - 1 %   Basophils Absolute 0.0 0.0 - 0.1 K/uL  Comprehensive metabolic panel     Status: Abnormal   Collection Time: 02/10/15  9:52 PM  Result Value Ref Range   Sodium 135 135 - 145 mmol/L   Potassium 4.0 3.5 - 5.1 mmol/L   Chloride 100 96 - 112 mmol/L   CO2 27 19 - 32 mmol/L   Glucose, Bld 119 (H) 70 - 99 mg/dL   BUN 18 6 - 23 mg/dL   Creatinine, Ser 0.80 0.50 - 1.10 mg/dL   Calcium 9.2 8.4 - 10.5  mg/dL   Total Protein 7.5 6.0 - 8.3 g/dL   Albumin 4.0 3.5 - 5.2 g/dL   AST 34 0 - 37 U/L   ALT 32 0 - 35 U/L   Alkaline Phosphatase 78 39 - 117 U/L   Total Bilirubin 1.1 0.3 - 1.2 mg/dL   GFR calc non Af Amer 78 (L) >90 mL/min   GFR calc Af Amer >90 >90 mL/min    Comment: (NOTE) The eGFR has been calculated using the CKD EPI equation. This calculation has not been validated in all clinical situations. eGFR's persistently <90 mL/min signify possible Chronic Kidney Disease.    Anion gap 8 5 - 15  Lipase, blood     Status: None   Collection Time: 02/10/15  9:52 PM  Result Value Ref Range   Lipase 21 11 - 59 U/L  Urinalysis, Routine w reflex microscopic     Status: Abnormal   Collection Time: 02/11/15 12:42 AM  Result Value Ref Range   Color, Urine AMBER (A) YELLOW    Comment: BIOCHEMICALS MAY BE AFFECTED BY COLOR   APPearance CLOUDY (A) CLEAR   Specific Gravity, Urine 1.027 1.005 - 1.030   pH 5.5 5.0 - 8.0   Glucose, UA NEGATIVE NEGATIVE mg/dL   Hgb urine dipstick NEGATIVE NEGATIVE   Bilirubin Urine NEGATIVE NEGATIVE   Ketones, ur  NEGATIVE NEGATIVE mg/dL   Protein, ur NEGATIVE NEGATIVE mg/dL   Urobilinogen, UA 0.2 0.0 - 1.0 mg/dL   Nitrite NEGATIVE NEGATIVE   Leukocytes, UA MODERATE (A) NEGATIVE  Urine microscopic-add on     Status: Abnormal   Collection Time: 02/11/15 12:42 AM  Result Value Ref Range   Squamous Epithelial / LPF FEW (A) RARE   WBC, UA 7-10 <3 WBC/hpf   RBC / HPF 0-2 <3 RBC/hpf   Bacteria, UA MANY (A) RARE   Urine-Other MICROSCOPIC EXAM PERFORMED ON UNCONCENTRATED URINE   Protime-INR     Status: None   Collection Time: 02/11/15 11:06 AM  Result Value Ref Range   Prothrombin Time 14.5 11.6 - 15.2 seconds   INR 1.12 0.00 - 1.49  APTT     Status: None   Collection Time: 02/11/15 11:06 AM  Result Value Ref Range   aPTT 37 24 - 37 seconds    Comment:        IF BASELINE aPTT IS ELEVATED, SUGGEST PATIENT RISK ASSESSMENT BE USED TO DETERMINE APPROPRIATE ANTICOAGULANT THERAPY.   Body fluid culture     Status: None (Preliminary result)   Collection Time: 02/11/15  4:00 PM  Result Value Ref Range   Specimen Description BILE    Special Requests NONE    Gram Stain      FEW WBC PRESENT,BOTH PMN AND MONONUCLEAR RARE GRAM POSITIVE COCCI IN PAIRS IN CHAINS Gram Stain Report Called to,Read Back By and Verified With: Gram Stain Report Called to,Read Back By and Verified With: DAWN $[email or web address]$  ON 02/12/15 BY KENDR Performed at [facility]    Culture PENDING    Report Status PENDING   CBC     Status: Abnormal   Collection Time: 02/12/15  5:06 AM  Result Value Ref Range   WBC 6.9 4.0 - 10.5 K/uL   RBC 3.85 (L) 3.87 - 5.11 MIL/uL   Hemoglobin 10.8 (L) 12.0 - 15.0 g/dL   HCT 35.0 (L) 36.0 - 46.0 %   MCV 90.9 78.0 - 100.0 fL   MCH 28.1 26.0 - 34.0 pg   MCHC 30.9 30.0 -  36.0 g/dL   RDW 13.1 11.5 - 15.5 %   Platelets 160 150 - 400 K/uL  Comprehensive metabolic panel     Status: Abnormal   Collection Time: 02/12/15  5:06 AM  Result Value Ref Range   Sodium 140 135 - 145 mmol/L   Potassium 4.1 3.5  - 5.1 mmol/L   Chloride 107 96 - 112 mmol/L   CO2 25 19 - 32 mmol/L   Glucose, Bld 109 (H) 70 - 99 mg/dL   BUN 14 6 - 23 mg/dL   Creatinine, Ser 0.77 0.50 - 1.10 mg/dL   Calcium 8.7 8.4 - 10.5 mg/dL   Total Protein 6.4 6.0 - 8.3 g/dL   Albumin 3.3 (L) 3.5 - 5.2 g/dL   AST 31 0 - 37 U/L   ALT 31 0 - 35 U/L   Alkaline Phosphatase 71 39 - 117 U/L   Total Bilirubin 0.7 0.3 - 1.2 mg/dL   GFR calc non Af Amer 89 (L) >90 mL/min   GFR calc Af Amer >90 >90 mL/min    Comment: (NOTE) The eGFR has been calculated using the CKD EPI equation. This calculation has not been validated in all clinical situations. eGFR's persistently <90 mL/min signify possible Chronic Kidney Disease.    Anion gap 8 5 - 15  Lipase, blood     Status: None   Collection Time: 02/12/15  5:06 AM  Result Value Ref Range   Lipase 19 11 - 59 U/L    Imaging / Studies: Ct Abdomen Pelvis W Contrast  02/10/2015   CLINICAL DATA:  Acute onset of right-sided abdominal pain for 4 days. Nausea. Initial encounter.  EXAM: CT ABDOMEN AND PELVIS WITH CONTRAST  TECHNIQUE: Multidetector CT imaging of the abdomen and pelvis was performed using the standard protocol following bolus administration of intravenous contrast.  CONTRAST:  130mL OMNIPAQUE IOHEXOL 300 MG/ML  SOLN  COMPARISON:  None.  FINDINGS: The visualized lung bases are clear.  Diffuse gallbladder wall thickening is noted, with several associated stones at the base of the gallbladder, and mild surrounding soft tissue inflammation. Findings are compatible with acute cholecystitis. A few mildly prominent nodes about the head of the pancreas may be related to the acute gallbladder process.  The liver and spleen are unremarkable in appearance. No intrahepatic biliary ductal dilatation is seen. The pancreas and adrenal glands are unremarkable.  There is minimal left-sided hydronephrosis, with slightly asymmetric prominence of the left ureter. Mild right-sided pelvicaliectasis is noted.  This is of uncertain significance, as there is no evidence of distal obstruction. This may simply be transient in nature. No renal or ureteral stones are identified. No perinephric stranding is seen.  No free fluid is identified. The small bowel is unremarkable in appearance. The stomach is within normal limits. No acute vascular abnormalities are seen.  The patient is status post resection of much of the colon. The ileocolic anastomosis at the level of the sigmoid colon is unremarkable in appearance.  The bladder is mildly distended and grossly unremarkable. The uterus is unremarkable in appearance. The ovaries are relatively symmetric. No suspicious adnexal masses are seen. No inguinal lymphadenopathy is seen.  No acute osseous abnormalities are identified. Vacuum phenomenon is noted at L5-S1, with underlying facet disease.  IMPRESSION: 1. Acute cholecystitis, with diffuse gallbladder wall thickening, several stones at the base of the gallbladder, and mild surrounding soft tissue inflammation. Few mildly prominent nodes about the head of the pancreas may be related to the acute gallbladder process.  2. Minimal left-sided hydronephrosis, without evidence of distal obstruction. This may simply be transient in nature.   Electronically Signed   By: Garald Balding M.D.   On: 02/10/2015 23:11   Ir Perc Cholecystostomy  02/11/2015   CLINICAL DATA:  Acute cholecystitis  EXAM: CHOLECYSTOSTOMY  FLUOROSCOPY TIME:  1 minutes  MEDICATIONS AND MEDICAL HISTORY: Versed 4 mg, Fentanyl 75 mcg.  Additional Medications: None  ANESTHESIA/SEDATION: Moderate sedation time: 30 minutes  CONTRAST:  10 cc Omnipaque 300  PROCEDURE: The procedure, risks, benefits, and alternatives were explained to the patient. Questions regarding the procedure were encouraged and answered. The patient understands and consents to the procedure.  The right upper quadrant was prepped with Betadine in a sterile fashion, and a sterile drape was applied covering  the operative field. A sterile gown and sterile gloves were used for the procedure.  A 21 gauge needle was inserted into the gallbladder lumen under sonographic guidance and via transhepatic approach. It was removed over a 018 wire, which was upsized to a 3-J. A 10-French drain was advanced over the wire and coiled in the gallbladder lumen. It was sewn to the skin after being string fixed.  FINDINGS: The cystic duct is occluded.  COMPLICATIONS: None  IMPRESSION: Successful cholecystostomy. This needs to remain in place at least 6 weeks.   Electronically Signed   By: Marybelle Killings M.D.   On: 02/11/2015 16:43    Medications / Allergies:  Scheduled Meds: . fentaNYL (SUBLIMAZE) injection  50 mcg Intravenous Once  . heparin  5,000 Units Subcutaneous 3 times per day  . pantoprazole (PROTONIX) IV  40 mg Intravenous QHS  . piperacillin-tazobactam (ZOSYN)  IV  3.375 g Intravenous 3 times per day   Continuous Infusions: . 0.9 % NaCl with KCl 20 mEq / L 125 mL/hr at 02/12/15 0226   PRN Meds:.acetaminophen **OR** acetaminophen, diphenhydrAMINE **OR** diphenhydrAMINE, HYDROmorphone (DILAUDID) injection, ondansetron  Antibiotics: Anti-infectives    Start     Dose/Rate Route Frequency Ordered Stop   02/11/15 1500  piperacillin-tazobactam (ZOSYN) IVPB 3.375 g     3.375 g 12.5 mL/hr over 240 Minutes Intravenous 3 times per day 02/11/15 1033     02/11/15 0830  piperacillin-tazobactam (ZOSYN) IVPB 3.375 g     3.375 g 100 mL/hr over 30 Minutes Intravenous  Once 02/11/15 0825 02/11/15 1034   02/11/15 0730  Ampicillin-Sulbactam (UNASYN) 3 g in sodium chloride 0.9 % 100 mL IVPB  Status:  Discontinued     3 g 100 mL/hr over 60 Minutes Intravenous  Once 02/11/15 0723 02/11/15 0817   02/11/15 0000  Ampicillin-Sulbactam (UNASYN) 3 g in sodium chloride 0.9 % 100 mL IVPB     3 g 100 mL/hr over 60 Minutes Intravenous  Once 02/10/15 2359 02/11/15 0120        Assessment/Plan Acute cholecystitis with  cholelithiasis S/p cholecystostomy tube -Cx with rare GPC and WBC.  ?should we add gram + coverage -tolerating POs, pain is greatly improved, WBC and LFTs are normal -c/w drain 6-8 weeks and follow up with Dr. Zella Richer for interval cholecsytectomy -drain care teaching -zosyn D#1/7 VTE prophylaxis-SCD/heparin FEN-no issues Dispo-anticipate DC today or tomorrow  Erby Pian, [facility] Pager [phone](7A-4:30P)   02/12/2015 8:33 AM

## 2015-02-12 NOTE — Progress Notes (Signed)
Referring Physician(s): CCS  Subjective: Patient states her RUQ abdominal pain is much improved, she has ambulated the halls today, tolerated a diet without nausea or vomiting and denies any fever or chills.   Allergies: Review of patient's allergies indicates no known allergies.  Medications: Prior to Admission medications   Medication Sig Start Date End Date Taking? Authorizing Provider  Cholecalciferol (VITAMIN D PO) Take 1 tablet by mouth daily.   Yes Historical Provider, MD  esomeprazole (NEXIUM) 40 MG capsule Take 40 mg by mouth daily at 12 noon.   Yes Historical Provider, MD  naproxen sodium (ANAPROX) 220 MG tablet Take 440 mg by mouth 2 (two) times daily as needed (pain).   Yes Historical Provider, MD  aspirin 81 MG tablet Take 81 mg by mouth at bedtime.     Historical Provider, MD  benzonatate (TESSALON) 200 MG capsule One every 4 hours as needed 09/04/14   Nyoka Cowden, MD  famotidine (PEPCID) 20 MG tablet One at bedtime 07/25/14   Nyoka Cowden, MD  nitroGLYCERIN (NITROSTAT) 0.4 MG SL tablet Place 0.4 mg under the tongue every 5 (five) minutes as needed for chest pain.    Historical Provider, MD  olmesartan-hydrochlorothiazide (BENICAR HCT) 20-12.5 MG per tablet Take 1 tablet by mouth daily. Patient taking differently: Take 1 tablet by mouth at bedtime.  08/07/14   Nyoka Cowden, MD  pantoprazole (PROTONIX) 40 MG tablet Take 1 tablet (40 mg total) by mouth daily. Take 30-60 min before first meal of the day 07/25/14   Nyoka Cowden, MD  predniSONE (DELTASONE) 10 MG tablet Take  4 each am x 2 days,   2 each am x 2 days,  1 each am x 2 days and stop 09/04/14   Nyoka Cowden, MD  traMADol Janean Sark) 50 MG tablet 1-2 every 4 hours as needed for cough or pain 09/04/14   Nyoka Cowden, MD   Vital Signs: BP 117/77 mmHg  Pulse 96  Temp(Src) 98.4 F (36.9 C) (Oral)  Resp 16  Ht  (1.626 m)  Wt 227 lb (102.967 kg)  BMI 38.95 kg/m2  SpO2 98%  Physical Exam General:  A&Ox3, NAD Abd: Soft, NT, ND, RUQ drain intact, minimally tender- serosang minimal in bag now, 24 hr 40cc, Cx pending  Imaging: Ct Abdomen Pelvis W Contrast  02/10/2015   CLINICAL DATA:  Acute onset of right-sided abdominal pain for 4 days. Nausea. Initial encounter.  EXAM: CT ABDOMEN AND PELVIS WITH CONTRAST  TECHNIQUE: Multidetector CT imaging of the abdomen and pelvis was performed using the standard protocol following bolus administration of intravenous contrast.  CONTRAST:  OMNIPAQUE IOHEXOL 300 MG/ML  SOLN  COMPARISON:  None.  FINDINGS: The visualized lung bases are clear.  Diffuse gallbladder wall thickening is noted, with several associated stones at the base of the gallbladder, and mild surrounding soft tissue inflammation. Findings are compatible with acute cholecystitis. A few mildly prominent nodes about the head of the pancreas may be related to the acute gallbladder process.  The liver and spleen are unremarkable in appearance. No intrahepatic biliary ductal dilatation is seen. The pancreas and adrenal glands are unremarkable.  There is minimal left-sided hydronephrosis, with slightly asymmetric prominence of the left ureter. Mild right-sided pelvicaliectasis is noted. This is of uncertain significance, as there is no evidence of distal obstruction. This may simply be transient in nature. No renal or ureteral stones are identified. No perinephric stranding is seen.  No free  fluid is identified. The small bowel is unremarkable in appearance. The stomach is within normal limits. No acute vascular abnormalities are seen.  The patient is status post resection of much of the colon. The ileocolic anastomosis at the level of the sigmoid colon is unremarkable in appearance.  The bladder is mildly distended and grossly unremarkable. The uterus is unremarkable in appearance. The ovaries are relatively symmetric. No suspicious adnexal masses are seen. No inguinal lymphadenopathy is seen.  No acute  osseous abnormalities are identified. Vacuum phenomenon is noted at L5-S1, with underlying facet disease.  IMPRESSION: 1. Acute cholecystitis, with diffuse gallbladder wall thickening, several stones at the base of the gallbladder, and mild surrounding soft tissue inflammation. Few mildly prominent nodes about the head of the pancreas may be related to the acute gallbladder process. 2. Minimal left-sided hydronephrosis, without evidence of distal obstruction. This may simply be transient in nature.   Electronically Signed   By: Roanna RaiderJeffery  Chang M.D.   On: 02/10/2015 23:11   Ir Perc Cholecystostomy  02/11/2015   CLINICAL DATA:  Acute cholecystitis  EXAM: CHOLECYSTOSTOMY  FLUOROSCOPY TIME:  1 minutes  MEDICATIONS AND MEDICAL HISTORY: Versed 4 mg, Fentanyl 75 mcg.  Additional Medications: None  ANESTHESIA/SEDATION: Moderate sedation time: 30 minutes  CONTRAST:  10 cc Omnipaque 300  PROCEDURE: The procedure, risks, benefits, and alternatives were explained to the patient. Questions regarding the procedure were encouraged and answered. The patient understands and consents to the procedure.  The right upper quadrant was prepped with Betadine in a sterile fashion, and a sterile drape was applied covering the operative field. A sterile gown and sterile gloves were used for the procedure.  A 21 gauge needle was inserted into the gallbladder lumen under sonographic guidance and via transhepatic approach. It was removed over a 018 wire, which was upsized to a 3-J. A 10-French drain was advanced over the wire and coiled in the gallbladder lumen. It was sewn to the skin after being string fixed.  FINDINGS: The cystic duct is occluded.  COMPLICATIONS: None  IMPRESSION: Successful cholecystostomy. This needs to remain in place at least 6 weeks.   Electronically Signed   By: Jolaine ClickArthur  Hoss M.D.   On: 02/11/2015 16:43    Labs:  CBC:  Recent Labs  09/04/14 1023 02/10/15 2152 02/12/15 0506  WBC 7.8 11.5* 6.9  HGB 13.5 12.7  10.8*  HCT 40.9 38.8 35.0*  PLT 178.0 186 160    COAGS:  Recent Labs  02/11/15 1106  INR 1.12  APTT 37    BMP:  Recent Labs  02/10/15 2152 02/12/15 0506  NA 135 140  K 4.0 4.1  CL 100 107  CO2 27 25  GLUCOSE 119* 109*  BUN 18 14  CALCIUM 9.2 8.7  CREATININE 0.80 0.77  GFRNONAA 78* 89*  GFRAA >90 >90    LIVER FUNCTION TESTS:  Recent Labs  02/10/15 2152 02/12/15 0506  BILITOT 1.1 0.7  AST 34 31  ALT 32 31  ALKPHOS 78 71  PROT 7.5 6.4  ALBUMIN 4.0 3.3*    Assessment and Plan: Acute cholecystitis with cholelithiasis  S/p perc cholecystostomy tube placement 4/27, good output, Cx pending-on Zosyn, wbc wnl, afebrile Plans- f/u 6-8 weeks per CCS, possible discharge 4/29   Signed: Pattricia BossMORGAN, Lempi Edwin D 02/12/2015, 2:10 PM   I spent a total of 15 Minutes in face to face in clinical consultation/evaluation, greater than 50% of which was counseling/coordinating care for acute cholecystitis

## 2015-02-13 MED ORDER — TRAMADOL HCL 50 MG PO TABS: ORAL_TABLET | ORAL | Status: DC

## 2015-02-13 MED ORDER — AMOXICILLIN-POT CLAVULANATE 875-125 MG PO TABS: 1.0000 | ORAL_TABLET | Freq: Two times a day (BID) | ORAL | Status: AC

## 2015-02-13 MED ORDER — SACCHAROMYCES BOULARDII 250 MG PO CAPS: 250.0000 mg | ORAL_CAPSULE | Freq: Two times a day (BID) | ORAL | Status: DC

## 2015-02-13 NOTE — Discharge Instructions (Signed)
Cholecystostomy  The gallbladder is a pear-shaped organ that lies beneath the liver on the right side of the body. The gallbladder stores bile, a fluid that helps the body digest fats. However, sometimes bile and other fluids build up in the gallbladder because of an obstruction (for example, a gallstones). This can cause fever, pain, swelling, nausea and other serious symptoms. The procedure used to drain these fluids is called a cholecystostomy. A tube is inserted into the gallbladder. Fluid drains through the tube into a plastic bag outside the body. This procedure is usually done on people who are admitted to the hospital. The procedure is often recommended for people who cannot have gallbladder surgery right away, usually because they are too ill to make it through surgery. The cholecystostomy tube is usually temporary, until surgery can be done. RISKS AND COMPLICATIONS Although rare, complications can include:  Clogging of the tube.  Infection in or around the drain site. Antibiotics might be prescribed for the infection. Or, another tube might be inserted to drain the infected fluid.  Internal bleeding from the liver. BEFORE THE PROCEDURE   Try to quit smoking several weeks before the procedure. Smoking can slow healing.  Arrange for someone to drive you home from the hospital.  Right before your procedure, avoid all foods and liquids after midnight. This includes coffee, tea and water.  On the day of the procedure, arrive early to fill out all the paperwork. PROCEDURE You will be given a sedative to make you sleepy and a local anesthetic to numb the skin. Next, a small cut is made in the abdomen. Then a tube is threaded through the cut into the gallbladder. The procedure is usually done with ultrasound to guide the tube into the gallbladder. Once the tube is in place, the drain is secured to the skin with a stitch. The tube is then connected to a drainage bag.  AFTER THE PROCEDURE    People who have a cholecystostomy usually stay in the hospital for several days because they are so ill. You might not be able to eat for the first few days. Instead, you will be connected to an IV for fluids and nutrients.  The procedure does not cure the blockage that caused the fluid to build up in the first place. Because of this, the gallbladder will need to be removed in the future. The drain is removed at that time. HOME CARE INSTRUCTIONS  Be sure to follow your healthcare provider's instructions carefully. You may shower but avoid tub baths and swimming until your caregiver says it is OK. Eat and drink according to the directions you have been given. And be sure to make all follow-up appointments.  Call your healthcare provider if you notice new pain, redness or swelling around the wound. SEEK IMMEDIATE MEDICAL CARE IF:   There is increased abdominal pain.  Nausea or vomiting occurs.  You develop a fever.  The drainage tube comes out of the abdomen. Document Released: 12/30/2008 Document Revised: 12/26/2011 Document Reviewed: 12/30/2008 [facility] Patient Information 2015 [employer] [place]. This information is not intended to replace advice given to you by your health care provider. Make sure you discuss any questions you have with your health care provider.   Be sure to take probitoics and yogurt to help maintain good bacteria in your gut. Be sure to eat a low fat diet Flush drain twice daily and keep a record of your output. Please call with any questions or concerns.

## 2015-02-13 NOTE — Discharge Summary (Signed)
Physician Discharge Summary  Terri [name] EAV:[id number] DOB: 07-28-1951 DOA: 02/10/2015  PCP: Verlon AuBoyd, Tammy Lamonica, MD  Consultation: none  Admit date: 02/10/2015 Discharge date: 02/13/2015  Recommendations for Outpatient Follow-up:   Follow-up Information    Schedule an appointment as soon as possible for a visit with Adolph PollackOSENBOWER,TODD J, MD.   Specialty:  General Surgery   Why:  2-3 weeks for gallbladder drain follow up   Contact information:   [address] [place] [id number] [phone]      Discharge Diagnoses:  1. Acute cholecystitis with cholelithiasis    Surgical Procedure: IR placement of cholecystostomy tube   Discharge Condition: stable Disposition: home  Diet recommendation: low fat   Filed Weights   02/10/15 1912  Weight: 102.967 kg (227 lb)       Hospital Course:  [name] x5 days.  [place] with acute cholecystitis with diffuse gallbladder wall thickening several stones at the base of the gallbladder, and mild surrounding soft tissue inflammation. Few mildly prominent nodes about the head of the pancreas may be related to the acute gallbladder process.  Due to potential risks of complications with cholecystectomy, she had a cholecystostomy tube placed.  She tolerated the procedure well.  She was kept on Zosyn.  Cultures to date show rare GCP.  Follow up labs were normal.  She tolerated a diet well and pain had resolved.  Vital signs remained stable.  On HD#3 the patient was felt stable for discharge home with additional 11 days of antibiotics, probiotics.  Medication risks, benefits and therapeutic alternatives were reviewed with the patient.  She verbalizes understanding.  Warning signs that warrant immediate attention were discussed.  She was encouraged to call with questions or concerns.  Follow up with Dr. Abbey Chattersosenbower.    Discharge Instructions     Medication  List    TAKE these medications        amoxicillin-clavulanate 875-125 MG per tablet  Commonly known as:  AUGMENTIN  Take 1 tablet by mouth 2 (two) times daily.     aspirin 81 MG tablet  Take 81 mg by mouth at bedtime.     benzonatate 200 MG capsule  Commonly known as:  TESSALON  One every 4 hours as needed     esomeprazole 40 MG capsule  Commonly known as:  NEXIUM  Take 40 mg by mouth daily at 12 noon.     famotidine 20 MG tablet  Commonly known as:  PEPCID  One at bedtime     naproxen sodium 220 MG tablet  Commonly known as:  ANAPROX  Take 440 mg by mouth 2 (two) times daily as needed (pain).     nitroGLYCERIN 0.4 MG SL tablet  Commonly known as:  NITROSTAT  Place 0.4 mg under the tongue every 5 (five) minutes as needed for chest pain.     olmesartan-hydrochlorothiazide 20-12.5 MG per tablet  Commonly known as:  BENICAR HCT  Take 1 tablet by mouth daily.     pantoprazole 40 MG tablet  Commonly known as:  PROTONIX  Take 1 tablet (40 mg total) by mouth daily. Take 30-60 min before first meal of the day     predniSONE 10 MG tablet  Commonly known as:  DELTASONE  Take  4 each am x 2 days,   2 each am x 2 days,  1 each am x 2 days and stop  saccharomyces boulardii 250 MG capsule  Commonly known as:  FLORASTOR  Take 1 capsule (250 mg total) by mouth 2 (two) times daily.     traMADol 50 MG tablet  Commonly known as:  ULTRAM  1-2 every 4 hours as needed for cough or pain     VITAMIN D PO  Take 1 tablet by mouth daily.           Follow-up Information    Schedule an appointment as soon as possible for a visit with Adolph Pollack, MD.   Specialty:  General Surgery   Why:  2-3 weeks for gallbladder drain follow up   Contact information:   [address] [place] [address] [phone]        The results of significant diagnostics from this hospitalization (including imaging, microbiology, ancillary and laboratory) are listed below for  reference.    Significant Diagnostic Studies: Ct Abdomen Pelvis W Contrast  02/10/2015   CLINICAL DATA:  Acute onset of right-sided abdominal pain for 4 days. Nausea. Initial encounter.  EXAM: CT ABDOMEN AND PELVIS WITH CONTRAST  TECHNIQUE: Multidetector CT imaging of the abdomen and pelvis was performed using the standard protocol following bolus administration of intravenous contrast.  CONTRAST:  OMNIPAQUE IOHEXOL 300 MG/ML  SOLN  COMPARISON:  None.  FINDINGS: The visualized lung bases are clear.  Diffuse gallbladder wall thickening is noted, with several associated stones at the base of the gallbladder, and mild surrounding soft tissue inflammation. Findings are compatible with acute cholecystitis. A few mildly prominent nodes about the head of the pancreas may be related to the acute gallbladder process.  The liver and spleen are unremarkable in appearance. No intrahepatic biliary ductal dilatation is seen. The pancreas and adrenal glands are unremarkable.  There is minimal left-sided hydronephrosis, with slightly asymmetric prominence of the left ureter. Mild right-sided pelvicaliectasis is noted. This is of uncertain significance, as there is no evidence of distal obstruction. This may simply be transient in nature. No renal or ureteral stones are identified. No perinephric stranding is seen.  No free fluid is identified. The small bowel is unremarkable in appearance. The stomach is within normal limits. No acute vascular abnormalities are seen.  The patient is status post resection of much of the colon. The ileocolic anastomosis at the level of the sigmoid colon is unremarkable in appearance.  The bladder is mildly distended and grossly unremarkable. The uterus is unremarkable in appearance. The ovaries are relatively symmetric. No suspicious adnexal masses are seen. No inguinal lymphadenopathy is seen.  No acute osseous abnormalities are identified. Vacuum phenomenon is noted at L5-S1, with  underlying facet disease.  IMPRESSION: 1. Acute cholecystitis, with diffuse gallbladder wall thickening, several stones at the base of the gallbladder, and mild surrounding soft tissue inflammation. Few mildly prominent nodes about the head of the pancreas may be related to the acute gallbladder process. 2. Minimal left-sided hydronephrosis, without evidence of distal obstruction. This may simply be transient in nature.   Electronically Signed   By: Roanna Raider M.D.   On: 02/10/2015 23:11   Ir Perc Cholecystostomy  02/11/2015   CLINICAL DATA:  Acute cholecystitis  EXAM: CHOLECYSTOSTOMY  FLUOROSCOPY TIME:  1 minutes  MEDICATIONS AND MEDICAL HISTORY: Versed 4 mg, Fentanyl 75 mcg.  Additional Medications: None  ANESTHESIA/SEDATION: Moderate sedation time: 30 minutes  CONTRAST:  10 cc Omnipaque 300  PROCEDURE: The procedure, risks, benefits, and alternatives were explained to the patient. Questions regarding the procedure were  encouraged and answered. The patient understands and consents to the procedure.  The right upper quadrant was prepped with Betadine in a sterile fashion, and a sterile drape was applied covering the operative field. A sterile gown and sterile gloves were used for the procedure.  A 21 gauge needle was inserted into the gallbladder lumen under sonographic guidance and via transhepatic approach. It was removed over a 018 wire, which was upsized to a 3-J. A 10-French drain was advanced over the wire and coiled in the gallbladder lumen. It was sewn to the skin after being string fixed.  FINDINGS: The cystic duct is occluded.  COMPLICATIONS: None  IMPRESSION: Successful cholecystostomy. This needs to remain in place at least 6 weeks.   Electronically Signed   By: Jolaine Click M.D.   On: 02/11/2015 16:43    Microbiology: Recent Results (from the past 240 hour(s))  Body fluid culture     Status: None (Preliminary result)   Collection Time: 02/11/15  4:00 PM  Result Value Ref Range Status    Specimen Description BILE  Final   Special Requests NONE  Final   Gram Stain   Final    FEW WBC PRESENT,BOTH PMN AND MONONUCLEAR RARE GRAM POSITIVE COCCI IN PAIRS IN CHAINS Gram Stain Report Called to,Read Back By and Verified With: Gram Stain Report Called to,Read Back By and Verified With: DAWN  ON 02/12/15 BY KENDR Performed at [facility]    Culture PENDING  Incomplete   Report Status PENDING  Incomplete     Labs: Basic Metabolic Panel:  Recent Labs Lab 02/10/15 2152 02/12/15 0506  NA 135 140  K 4.0 4.1  CL 100 107  CO2 27 25  GLUCOSE 119* 109*  BUN 18 14  CREATININE 0.80 0.77  CALCIUM 9.2 8.7   Liver Function Tests:  Recent Labs Lab 02/10/15 2152 02/12/15 0506  AST 34 31  ALT 32 31  ALKPHOS 78 71  BILITOT 1.1 0.7  PROT 7.5 6.4  ALBUMIN 4.0 3.3*    Recent Labs Lab 02/10/15 2152 02/12/15 0506  LIPASE 21 19   No results for input(s): AMMONIA in the last 168 hours. CBC:  Recent Labs Lab 02/10/15 2152 02/12/15 0506  WBC 11.5* 6.9  NEUTROABS 8.2*  --   HGB 12.7 10.8*  HCT 38.8 35.0*  MCV 89.0 90.9  PLT 186 160   Cardiac Enzymes: No results for input(s): CKTOTAL, CKMB, CKMBINDEX, TROPONINI in the last 168 hours. BNP: BNP (last 3 results) No results for input(s): BNP in the last 8760 hours.  ProBNP (last 3 results) No results for input(s): PROBNP in the last 8760 hours.  CBG: No results for input(s): GLUCAP in the last 168 hours.  Active Problems:   Acute cholecystitis   Cholelithiasis and acute cholecystitis without obstruction   Time coordinating discharge: <30 mins   Signed:  Kalie Cabral, ANP-BC

## 2015-02-13 NOTE — Care Management Note (Signed)
    Page 1 of 1   02/13/2015     10:12:07 AM CARE MANAGEMENT NOTE 02/13/2015  Patient:  Terri [name],Terri [name]   Account Number:  [id number]  Date Initiated:  02/13/2015  Documentation initiated by:  Lorenda IshiharaPEELE,Jozsef Wescoat  Subjective/Objective Assessment:   64 yo female admitted with acute cholecystitis. 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     Action/Plan:   Discharge planning   Anticipated DC Date:  02/13/2015   Anticipated DC Plan:  HOME W HOME HEALTH SERVICES      DC Planning Services  CM consult      [facility]   Choice offered to / List presented to:  C-1 Patient        HH arranged  HH-1 RN  HH-10 DISEASE MANAGEMENT      HH agency  [facility]   Status of service:  Completed, signed off Medicare Important Message given?   (If response is "NO", the following Medicare IM given date fields will be blank) Date Medicare IM given:   Medicare IM given by:   Date Additional Medicare IM given:   Additional Medicare IM given by:    Discharge Disposition:  HOME W HOME HEALTH SERVICES  Per UR Regulation:  Reviewed for med. necessity/level of care/duration of stay  If discussed at Long Length of Stay Meetings, dates discussed:    Comments:  02-13-15 Lorenda IshiharaSuzanne Hosea [place] RN CM 1010 Spoke with patient at bedside, discussed [facility]. Patient states [facility] already active with her mother so she wants them to provide her services as well. Contacted [facility] to arrange.

## 2015-02-14 LAB — BODY FLUID CULTURE

## 2015-03-19 ENCOUNTER — Encounter: Payer: Self-pay | Admitting: General Surgery

## 2015-03-19 NOTE — Progress Notes (Signed)
Terri [name] 03/19/2015 4:44 PM Location: [facility] Patient #: [id number] DOB: 04/05/1951 Single / Language: [place] / Race: White Female History of Present Illness Adolph Pollack(Shawnese Magner J. Aizza Santiago MD; 03/19/2015 5:30 PM) The patient is a 64 year old female    Note:She is here for another follow-up visit following percutaneous cholecystostomy tube placement for acute cholecystitis February 11, 2015. Yesterday, her mother fell and she was helping her up when she got some sharp pain around the drain site. It is a little better today. Drain output is unchanged. No fever.  Allergies Fay Records(Ashley Beck, [place]; 03/19/2015 4:44 PM) No Known Drug Allergies 02/27/2015  Medication History Fay Records(Ashley Beck, [place]; 03/19/2015 4:47 PM) TraMADol HCl (50MG  Tablet, Oral) Active. Amoxicillin-Pot Clavulanate (875-125MG  Tablet, Oral) Active. PredniSONE (10MG  Tablet, Oral) Active. Pantoprazole Sodium (40MG  Tablet DR, Oral) Active. Benicar HCT (20-12.5MG  Tablet, Oral) Active. Benzonatate (200MG  Capsule, Oral) Active. Esomeprazole Magnesium (40MG  Capsule DR, Oral) Active. Naproxen Sodium (220MG  Capsule, Oral) Active. Aspirin (81MG  Tablet, Oral) Active. Florastor (250MG  Capsule, Oral) Active. Nitroglycerin (0.4MG  Tab Sublingual, Sublingual) Active. Famotidine (20MG  Tablet, Oral) Active. Cholecalciferol (1000UNIT Capsule, Oral) Active. Medications Reconciled     Review of Systems Adolph Pollack(Fidencia Mccloud J. Samanda Buske MD; 03/19/2015 5:30 PM)  Note: Positive for a chronic cough.   Vitals Fay Records(Ashley Beck CMA; 03/19/2015 4:48 PM) 03/19/2015 4:47 PM Weight: 219 lb Height: 64in Body Surface Area: 2.12 m Body Mass Index: 37.59 kg/m Temp.: 98.22F(Oral)  Pulse: 88 (Regular)  Resp.: 17 (Unlabored)  BP: 124/70 (Sitting, Left Arm, Standard)     Physical Exam Adolph Pollack(Jaleiah Asay J. Jrue Yambao MD; 03/19/2015 5:31 PM)  The physical exam findings are as follows: Note:General-obese female in no acute distress.  Eyes-no  icterus.  Cardiovascular-regular rate and rhythm.  Abdomen-soft, no tenderness, drain in right upper quadrant with Golden liquid output.  Back-no CVA tenderness.    Assessment & Plan Adolph Pollack(Justyce Baby J. Rainey Kahrs MD; 03/19/2015 5:32 PM)  ACUTE CHOLECYSTITIS (575.0  K81.0) Impression: Status post percutaneous cholecystostomy treatment. She is now ready to proceed with laparoscopic possible open cholecystectomy. She's had multiple previous abdominal surgeries.  Plan: Schedule laparoscopic possible open cholecystectomy. I have explained the procedure, risks, and aftercare of cholecystectomy. Risks include but are not limited to bleeding, infection, wound problems, anesthesia, diarrhea, bile leak, injury to common bile duct/liver/intestine. She seems to understand and agrees to proceed.  Current Plans Started TraMADol HCl 50MG , 1 (one) Tablet every six hours, as needed, #30, 03/19/2015, No Refill. Schedule for Surgery  Avel Peaceodd Guiseppe Flanagan, MD

## 2015-04-06 NOTE — Patient Instructions (Addendum)
Terri [name]  04/06/2015   Your procedure is scheduled on: April 09, 2015  Report to [facility] Main  Entrance and follow signs to               Short Stay Center at 8:30 AM.  Call this number if you have problems the morning of surgery [phone]   Remember: ONLY 1 PERSON MAY GO WITH YOU TO SHORT STAY TO GET  READY MORNING OF YOUR SURGERY.  Do not eat food or drink liquids :After Midnight.     Take these medicines the morning of surgery with A SIP OF WATER:  NONe                               You may not have any metal on your body including hair pins and              piercings  Do not wear jewelry, make-up, lotions, powders or perfumes, deodorant             Do not wear nail polish.  Do not shave  48 hours prior to surgery.  .   Do not bring valuables to the hospital. [place] IS NOT             RESPONSIBLE   FOR VALUABLES.  Contacts, dentures or bridgework may not be worn into surgery.  Leave suitcase in the car. After surgery it may be brought to your room.     Patients discharged the day of surgery will not be allowed to drive home.  Name and phone number of your driver:  Special Instructions:Coughing and deep breathing exercises, leg exercises              Please read over the following fact sheets you were given: _____________________________________________________________________             [facility] - Preparing for Surgery Before surgery, you can play an important role.  Because skin is not sterile, your skin needs to be as free of germs as possible.  You can reduce the number of germs on your skin by washing with CHG (chlorahexidine gluconate) soap before surgery.  CHG is an antiseptic cleaner which kills germs and bonds with the skin to continue killing germs even after washing. Please DO NOT use if you have an allergy to CHG or antibacterial soaps.  If your skin becomes reddened/irritated stop using the CHG and inform your nurse when  you arrive at Short Stay. Do not shave (including legs and underarms) for at least 48 hours prior to the first CHG shower.  You may shave your face/neck. Please follow these instructions carefully:  1.  Shower with CHG Soap the night before surgery and the  morning of Surgery.  2.  If you choose to wash your hair, wash your hair first as usual with your  normal  shampoo.  3.  After you shampoo, rinse your hair and body thoroughly to remove the  shampoo.                           4.  Use CHG as you would any other liquid soap.  You can apply chg directly  to the skin and wash  Gently with a scrungie or clean washcloth.  5.  Apply the CHG Soap to your body ONLY FROM THE NECK DOWN.   Do not use on face/ open                           Wound or open sores. Avoid contact with eyes, ears mouth and genitals (private parts).                       Wash face,  Genitals (private parts) with your normal soap.             6.  Wash thoroughly, paying special attention to the area where your surgery  will be performed.  7.  Thoroughly rinse your body with warm water from the neck down.  8.  DO NOT shower/wash with your normal soap after using and rinsing off  the CHG Soap.                9.  Pat yourself dry with a clean towel.            10.  Wear clean pajamas.            11.  Place clean sheets on your bed the night of your first shower and do not  sleep with pets. Day of Surgery : Do not apply any lotions/deodorants the morning of surgery.  Please wear clean clothes to the hospital/surgery center.  FAILURE TO FOLLOW THESE INSTRUCTIONS MAY RESULT IN THE CANCELLATION OF YOUR SURGERY PATIENT SIGNATURE_________________________________  NURSE SIGNATURE__________________________________  ________________________________________________________________________

## 2015-04-08 ENCOUNTER — Encounter (HOSPITAL_COMMUNITY): Payer: Self-pay

## 2015-04-08 ENCOUNTER — Encounter (HOSPITAL_COMMUNITY)
Admission: RE | Admit: 2015-04-08 | Discharge: 2015-04-08 | Disposition: A | Discharge status: Home / Self Care | Payer: PRIVATE HEALTH INSURANCE | Source: Ambulatory Visit | Attending: General Surgery | Admitting: General Surgery

## 2015-04-08 DIAGNOSIS — K8010 Calculus of gallbladder with chronic cholecystitis without obstruction: Secondary | ICD-10-CM | POA: Diagnosis present

## 2015-04-08 DIAGNOSIS — E669 Obesity, unspecified: Secondary | ICD-10-CM | POA: Diagnosis not present

## 2015-04-08 DIAGNOSIS — Z6837 Body mass index (BMI) 37.0-37.9, adult: Secondary | ICD-10-CM | POA: Diagnosis not present

## 2015-04-08 DIAGNOSIS — M1990 Unspecified osteoarthritis, unspecified site: Secondary | ICD-10-CM | POA: Diagnosis not present

## 2015-04-08 DIAGNOSIS — R569 Unspecified convulsions: Secondary | ICD-10-CM | POA: Diagnosis not present

## 2015-04-08 DIAGNOSIS — Z7982 Long term (current) use of aspirin: Secondary | ICD-10-CM | POA: Diagnosis not present

## 2015-04-08 DIAGNOSIS — K219 Gastro-esophageal reflux disease without esophagitis: Secondary | ICD-10-CM | POA: Diagnosis not present

## 2015-04-08 DIAGNOSIS — K8012 Calculus of gallbladder with acute and chronic cholecystitis without obstruction: Secondary | ICD-10-CM | POA: Diagnosis not present

## 2015-04-08 DIAGNOSIS — I10 Essential (primary) hypertension: Secondary | ICD-10-CM | POA: Diagnosis not present

## 2015-04-08 HISTORY — DX: Gastro-esophageal reflux disease without esophagitis: K21.9

## 2015-04-08 HISTORY — DX: Unspecified osteoarthritis, unspecified site: M19.90

## 2015-04-08 LAB — COMPREHENSIVE METABOLIC PANEL
ALBUMIN: 4.3 g/dL (ref 3.5–5.0)
ALT: 25 U/L (ref 14–54)
ANION GAP: 9 (ref 5–15)
AST: 31 U/L (ref 15–41)
Alkaline Phosphatase: 83 U/L (ref 38–126)
BUN: 16 mg/dL (ref 6–20)
CALCIUM: 9.9 mg/dL (ref 8.9–10.3)
CO2: 29 mmol/L (ref 22–32)
CREATININE: 0.91 mg/dL (ref 0.44–1.00)
Chloride: 101 mmol/L (ref 101–111)
GFR calc non Af Amer: >60 mL/min (ref >60)
Glucose, Bld: 109 mg/dL — ABNORMAL HIGH (ref 65–99)
Potassium: 5 mmol/L (ref 3.5–5.1)
Sodium: 139 mmol/L (ref 135–145)
TOTAL PROTEIN: 7.6 g/dL (ref 6.5–8.1)
Total Bilirubin: 0.9 mg/dL (ref 0.3–1.2)

## 2015-04-08 LAB — CBC WITH DIFFERENTIAL/PLATELET
BASOS PCT: 0 % (ref 0–1)
Basophils Absolute: 0 10*3/uL (ref 0.0–0.1)
Eosinophils Absolute: 0.1 10*3/uL (ref 0.0–0.7)
Eosinophils Relative: 2 % (ref 0–5)
HEMATOCRIT: 38.5 % (ref 36.0–46.0)
Hemoglobin: 12.3 g/dL (ref 12.0–15.0)
LYMPHS PCT: 32 % (ref 12–46)
Lymphs Abs: 2.1 10*3/uL (ref 0.7–4.0)
MCH: 28.3 pg (ref 26.0–34.0)
MCHC: 31.9 g/dL (ref 30.0–36.0)
MCV: 88.7 fL (ref 78.0–100.0)
MONO ABS: 0.4 10*3/uL (ref 0.1–1.0)
Monocytes Relative: 6 % (ref 3–12)
NEUTROS ABS: 4 10*3/uL (ref 1.7–7.7)
Neutrophils Relative %: 60 % (ref 43–77)
PLATELETS: 216 10*3/uL (ref 150–400)
RBC: 4.34 MIL/uL (ref 3.87–5.11)
RDW: 13.1 % (ref 11.5–15.5)
WBC: 6.6 10*3/uL (ref 4.0–10.5)

## 2015-04-08 LAB — PROTIME-INR
INR: 1.09 (ref 0.00–1.49)
Prothrombin Time: 14.3 seconds (ref 11.6–15.2)

## 2015-04-08 NOTE — Progress Notes (Signed)
02-11-15 - [facility]/UA mirco (labs) EPIC 02-10-15 [facility] ED (acute cholecystitis) - EPIC 02-10-15 - [facility] ED - CT abd/pelvis - EPIC 09-04-14 - LOV Dr. Sherene Sires (pulmonary) - EPIC 07-25-14- 2V CXR - EPIC

## 2015-04-09 ENCOUNTER — Ambulatory Visit (HOSPITAL_COMMUNITY): Payer: PRIVATE HEALTH INSURANCE | Admitting: Certified Registered"

## 2015-04-09 ENCOUNTER — Ambulatory Visit (HOSPITAL_COMMUNITY)
Admission: RE | Admit: 2015-04-09 | Discharge: 2015-04-10 | Disposition: A | Discharge status: Home / Self Care | Payer: PRIVATE HEALTH INSURANCE | Source: Ambulatory Visit | Attending: General Surgery | Admitting: General Surgery

## 2015-04-09 ENCOUNTER — Encounter (HOSPITAL_COMMUNITY): Payer: Self-pay | Admitting: *Deleted

## 2015-04-09 ENCOUNTER — Ambulatory Visit (HOSPITAL_COMMUNITY): Payer: PRIVATE HEALTH INSURANCE

## 2015-04-09 ENCOUNTER — Encounter (HOSPITAL_COMMUNITY)
Admission: RE | Disposition: A | Discharge status: Home / Self Care | Payer: Self-pay | Source: Ambulatory Visit | Attending: General Surgery

## 2015-04-09 DIAGNOSIS — K8010 Calculus of gallbladder with chronic cholecystitis without obstruction: Secondary | ICD-10-CM | POA: Diagnosis present

## 2015-04-09 DIAGNOSIS — E669 Obesity, unspecified: Secondary | ICD-10-CM | POA: Insufficient documentation

## 2015-04-09 DIAGNOSIS — K219 Gastro-esophageal reflux disease without esophagitis: Secondary | ICD-10-CM | POA: Insufficient documentation

## 2015-04-09 DIAGNOSIS — Z7982 Long term (current) use of aspirin: Secondary | ICD-10-CM | POA: Insufficient documentation

## 2015-04-09 DIAGNOSIS — M1990 Unspecified osteoarthritis, unspecified site: Secondary | ICD-10-CM | POA: Insufficient documentation

## 2015-04-09 DIAGNOSIS — I10 Essential (primary) hypertension: Secondary | ICD-10-CM | POA: Insufficient documentation

## 2015-04-09 DIAGNOSIS — Z6837 Body mass index (BMI) 37.0-37.9, adult: Secondary | ICD-10-CM | POA: Insufficient documentation

## 2015-04-09 DIAGNOSIS — K8012 Calculus of gallbladder with acute and chronic cholecystitis without obstruction: Secondary | ICD-10-CM | POA: Insufficient documentation

## 2015-04-09 DIAGNOSIS — Z419 Encounter for procedure for purposes other than remedying health state, unspecified: Secondary | ICD-10-CM

## 2015-04-09 DIAGNOSIS — K810 Acute cholecystitis: Secondary | ICD-10-CM

## 2015-04-09 DIAGNOSIS — R569 Unspecified convulsions: Secondary | ICD-10-CM | POA: Insufficient documentation

## 2015-04-09 HISTORY — PX: CHOLECYSTECTOMY: SHX55

## 2015-04-09 SURGERY — LAPAROSCOPIC CHOLECYSTECTOMY WITH INTRAOPERATIVE CHOLANGIOGRAM
Anesthesia: General | Site: Abdomen

## 2015-04-09 MED ORDER — LACTATED RINGERS IV SOLN
INTRAVENOUS | Status: DC
  Administered 2015-04-09: 1000 mL via INTRAVENOUS

## 2015-04-09 MED ORDER — BUPIVACAINE HCL (PF) 0.5 % IJ SOLN
INTRAMUSCULAR | Status: AC
  Filled 2015-04-09: qty 30

## 2015-04-09 MED ORDER — LIDOCAINE HCL (CARDIAC) 20 MG/ML IV SOLN
INTRAVENOUS | Status: AC
  Filled 2015-04-09: qty 5

## 2015-04-09 MED ORDER — ONDANSETRON HCL 4 MG/2ML IJ SOLN
INTRAMUSCULAR | Status: DC | PRN
  Administered 2015-04-09: 4 mg via INTRAVENOUS

## 2015-04-09 MED ORDER — CEFAZOLIN SODIUM 1-5 GM-% IV SOLN
1.0000 g | Freq: Four times a day (QID) | INTRAVENOUS | Status: AC
  Administered 2015-04-09 – 2015-04-10 (×3): 1 g via INTRAVENOUS
  Filled 2015-04-09 (×3): qty 50

## 2015-04-09 MED ORDER — LIDOCAINE HCL (PF) 2 % IJ SOLN
INTRAMUSCULAR | Status: DC | PRN
  Administered 2015-04-09: 30 mg via INTRADERMAL

## 2015-04-09 MED ORDER — CEFAZOLIN SODIUM-DEXTROSE 2-3 GM-% IV SOLR
2.0000 g | INTRAVENOUS | Status: AC
  Administered 2015-04-09: 2 g via INTRAVENOUS

## 2015-04-09 MED ORDER — FENTANYL CITRATE (PF) 250 MCG/5ML IJ SOLN
INTRAMUSCULAR | Status: AC
  Filled 2015-04-09: qty 5

## 2015-04-09 MED ORDER — ONDANSETRON HCL 4 MG/2ML IJ SOLN
INTRAMUSCULAR | Status: AC
  Filled 2015-04-09: qty 2

## 2015-04-09 MED ORDER — MIDAZOLAM HCL 5 MG/5ML IJ SOLN
INTRAMUSCULAR | Status: DC | PRN
  Administered 2015-04-09 (×2): 1 mg via INTRAVENOUS

## 2015-04-09 MED ORDER — LACTATED RINGERS IR SOLN
Status: DC | PRN
  Administered 2015-04-09: 1000 mL

## 2015-04-09 MED ORDER — ROCURONIUM BROMIDE 100 MG/10ML IV SOLN
INTRAVENOUS | Status: DC | PRN
  Administered 2015-04-09: 10 mg via INTRAVENOUS
  Administered 2015-04-09: 30 mg via INTRAVENOUS
  Administered 2015-04-09: 10 mg via INTRAVENOUS

## 2015-04-09 MED ORDER — NITROGLYCERIN 0.4 MG SL SUBL: 0.4000 mg | SUBLINGUAL_TABLET | SUBLINGUAL | Status: DC | PRN

## 2015-04-09 MED ORDER — NEOSTIGMINE METHYLSULFATE 10 MG/10ML IV SOLN
INTRAVENOUS | Status: DC | PRN
  Administered 2015-04-09: 4 mg via INTRAVENOUS

## 2015-04-09 MED ORDER — NEOSTIGMINE METHYLSULFATE 10 MG/10ML IV SOLN
INTRAVENOUS | Status: AC
  Filled 2015-04-09: qty 1

## 2015-04-09 MED ORDER — HYDROMORPHONE HCL 1 MG/ML IJ SOLN: 0.2500 mg | INTRAMUSCULAR | Status: DC | PRN

## 2015-04-09 MED ORDER — GLYCOPYRROLATE 0.2 MG/ML IJ SOLN
INTRAMUSCULAR | Status: DC | PRN
  Administered 2015-04-09: 0.6 mg via INTRAVENOUS

## 2015-04-09 MED ORDER — DEXAMETHASONE SODIUM PHOSPHATE 10 MG/ML IJ SOLN
INTRAMUSCULAR | Status: DC | PRN
  Administered 2015-04-09: 10 mg via INTRAVENOUS

## 2015-04-09 MED ORDER — FENTANYL CITRATE (PF) 100 MCG/2ML IJ SOLN
INTRAMUSCULAR | Status: DC | PRN
  Administered 2015-04-09 (×3): 50 ug via INTRAVENOUS
  Administered 2015-04-09: 100 ug via INTRAVENOUS

## 2015-04-09 MED ORDER — MORPHINE SULFATE 2 MG/ML IJ SOLN
2.0000 mg | INTRAMUSCULAR | Status: DC | PRN
  Administered 2015-04-09 (×2): 2 mg via INTRAVENOUS
  Filled 2015-04-09 (×2): qty 1

## 2015-04-09 MED ORDER — BUPIVACAINE HCL 0.5 % IJ SOLN
INTRAMUSCULAR | Status: DC | PRN
  Administered 2015-04-09: 5 mL

## 2015-04-09 MED ORDER — DEXAMETHASONE SODIUM PHOSPHATE 10 MG/ML IJ SOLN
INTRAMUSCULAR | Status: AC
  Filled 2015-04-09: qty 1

## 2015-04-09 MED ORDER — ONDANSETRON HCL 4 MG/2ML IJ SOLN
4.0000 mg | INTRAMUSCULAR | Status: DC | PRN
Start: 2015-04-09 — End: 2015-04-10

## 2015-04-09 MED ORDER — ROCURONIUM BROMIDE 100 MG/10ML IV SOLN
INTRAVENOUS | Status: AC
  Filled 2015-04-09: qty 1

## 2015-04-09 MED ORDER — EPHEDRINE SULFATE 50 MG/ML IJ SOLN
INTRAMUSCULAR | Status: DC | PRN
  Administered 2015-04-09: 10 mg via INTRAVENOUS

## 2015-04-09 MED ORDER — SUCCINYLCHOLINE CHLORIDE 20 MG/ML IJ SOLN
INTRAMUSCULAR | Status: DC | PRN
  Administered 2015-04-09: 100 mg via INTRAVENOUS

## 2015-04-09 MED ORDER — HYDROCODONE-ACETAMINOPHEN 5-325 MG PO TABS
1.0000 | ORAL_TABLET | ORAL | Status: DC | PRN
  Administered 2015-04-09: 2 via ORAL
  Filled 2015-04-09: qty 2

## 2015-04-09 MED ORDER — CEFAZOLIN SODIUM-DEXTROSE 2-3 GM-% IV SOLR
INTRAVENOUS | Status: AC
  Filled 2015-04-09: qty 50

## 2015-04-09 MED ORDER — DEXTROSE IN LACTATED RINGERS 5 % IV SOLN
INTRAVENOUS | Status: DC
  Administered 2015-04-09 (×2): via INTRAVENOUS

## 2015-04-09 MED ORDER — PROPOFOL 10 MG/ML IV BOLUS
INTRAVENOUS | Status: AC
  Filled 2015-04-09: qty 20

## 2015-04-09 MED ORDER — GLYCOPYRROLATE 0.2 MG/ML IJ SOLN
INTRAMUSCULAR | Status: AC
  Filled 2015-04-09: qty 3

## 2015-04-09 MED ORDER — 0.9 % SODIUM CHLORIDE (POUR BTL) OPTIME
TOPICAL | Status: DC | PRN
  Administered 2015-04-09: 1000 mL

## 2015-04-09 MED ORDER — PROPOFOL 10 MG/ML IV BOLUS
INTRAVENOUS | Status: DC | PRN
  Administered 2015-04-09: 140 mg via INTRAVENOUS

## 2015-04-09 MED ORDER — MIDAZOLAM HCL 2 MG/2ML IJ SOLN
INTRAMUSCULAR | Status: AC
  Filled 2015-04-09: qty 2

## 2015-04-09 MED ORDER — ONDANSETRON HCL 4 MG PO TABS: 4.0000 mg | ORAL_TABLET | Freq: Four times a day (QID) | ORAL | Status: DC | PRN

## 2015-04-09 SURGICAL SUPPLY — 36 items
APPLIER CLIP 5 13 M/L LIGAMAX5 (MISCELLANEOUS) ×2
BENZOIN TINCTURE PRP APPL 2/3 (GAUZE/BANDAGES/DRESSINGS) ×2 IMPLANT
CATH REDDICK CHOLANGI 4FR 50CM (CATHETERS) ×2 IMPLANT
CHLORAPREP W/TINT 26ML (MISCELLANEOUS) ×2 IMPLANT
CLIP APPLIE 5 13 M/L LIGAMAX5 (MISCELLANEOUS) ×1 IMPLANT
COVER MAYO STAND STRL (DRAPES) ×2 IMPLANT
DECANTER SPIKE VIAL GLASS SM (MISCELLANEOUS) ×2 IMPLANT
DISSECTOR BLUNT TIP ENDO 5MM (MISCELLANEOUS) ×2 IMPLANT
DRAPE C-ARM 42X120 X-RAY (DRAPES) ×2 IMPLANT
DRAPE LAPAROSCOPIC ABDOMINAL (DRAPES) ×2 IMPLANT
DRAPE UTILITY XL STRL (DRAPES) ×2 IMPLANT
DRSG TEGADERM 2-3/8X2-3/4 SM (GAUZE/BANDAGES/DRESSINGS) ×6 IMPLANT
ELECT REM PT RETURN 9FT ADLT (ELECTROSURGICAL) ×2
ELECTRODE REM PT RTRN 9FT ADLT (ELECTROSURGICAL) ×1 IMPLANT
ENDOLOOP SUT PDS II  0 18 (SUTURE) ×1
ENDOLOOP SUT PDS II 0 18 (SUTURE) ×1 IMPLANT
GAUZE SPONGE 2X2 8PLY STRL LF (GAUZE/BANDAGES/DRESSINGS) IMPLANT
GLOVE ECLIPSE 8.0 STRL XLNG CF (GLOVE) ×4 IMPLANT
GLOVE INDICATOR 8.0 STRL GRN (GLOVE) ×4 IMPLANT
GOWN STRL REUS W/TWL XL LVL3 (GOWN DISPOSABLE) ×12 IMPLANT
HEMOSTAT SNOW SURGICEL 2X4 (HEMOSTASIS) ×2 IMPLANT
IV CATH 14GX2 1/4 (CATHETERS) ×2 IMPLANT
KIT BASIN OR (CUSTOM PROCEDURE TRAY) ×2 IMPLANT
POUCH SPECIMEN RETRIEVAL 10MM (ENDOMECHANICALS) ×2 IMPLANT
SCISSORS LAP 5X35 DISP (ENDOMECHANICALS) ×2 IMPLANT
SET IRRIG TUBING LAPAROSCOPIC (IRRIGATION / IRRIGATOR) ×2 IMPLANT
SPONGE GAUZE 2X2 STER 10/PKG (GAUZE/BANDAGES/DRESSINGS)
STRIP CLOSURE SKIN 1/2X4 (GAUZE/BANDAGES/DRESSINGS) ×2 IMPLANT
SUT MNCRL AB 4-0 PS2 18 (SUTURE) ×2 IMPLANT
TOWEL OR 17X26 10 PK STRL BLUE (TOWEL DISPOSABLE) ×2 IMPLANT
TOWEL OR NON WOVEN STRL DISP B (DISPOSABLE) ×2 IMPLANT
TRAY LAPAROSCOPIC (CUSTOM PROCEDURE TRAY) ×2 IMPLANT
TROCAR BLADELESS OPT 5 100 (ENDOMECHANICALS) ×2 IMPLANT
TROCAR OPTI TIP 5M 100M (ENDOMECHANICALS) ×2 IMPLANT
TROCAR XCEL BLUNT TIP 100MML (ENDOMECHANICALS) ×2 IMPLANT
TROCAR XCEL NON-BLD 11X100MML (ENDOMECHANICALS) ×2 IMPLANT

## 2015-04-09 NOTE — Discharge Instructions (Addendum)
[facility] ______[facility] LAPAROSCOPIC SURGERY: POST OP INSTRUCTIONS Always review your discharge instruction sheet given to you by the facility where your surgery was performed. IF YOU HAVE DISABILITY OR FAMILY LEAVE FORMS, YOU MUST BRING THEM TO THE OFFICE FOR PROCESSING.   DO NOT GIVE THEM TO YOUR DOCTOR.  1. A prescription for pain medication may be given to you upon discharge.  Take your pain medication as prescribed, if needed.  If narcotic pain medicine is not needed, then you may take acetaminophen (Tylenol) or ibuprofen (Advil) as needed. 2. Take your usually prescribed medications unless otherwise directed. 3. If you need a refill on your pain medication, please contact your pharmacy.  They will contact our office to request authorization. Prescriptions will not be filled after 5pm or on week-ends. 4. You should follow a light diet the first few days after arrival home, such as soup and crackers, etc.  Be sure to include lots of fluids daily. 5. Most patients will experience some swelling and bruising in the area of the incisions.  Ice packs will help.  Swelling and bruising can take several days to resolve.  6. It is common to experience some constipation if taking pain medication after surgery.  Increasing fluid intake and taking a stool softener (such as Colace) will usually help or prevent this problem from occurring.  A mild laxative (Milk of Magnesia or Miralax) should be taken according to package instructions if there are no bowel movements after 48 hours. 7. Unless discharge instructions indicate otherwise, you may remove your bandages 24-48 hours after surgery, and you may shower at that time.  You may have steri-strips (small skin tapes) in place directly over the incision.  These strips should be left on the skin for 7-10 days.  If your surgeon used skin glue on the incision, you may shower in 24 hours.  The glue will flake off over the next 2-3 weeks.  Any sutures or  staples will be removed at the office during your follow-up visit. 8. ACTIVITIES:  You may resume regular (light) daily activities beginning the next day--such as daily self-care, walking, climbing stairs--gradually increasing activities as tolerated.  You may have sexual intercourse when it is comfortable.  Refrain from any heavy lifting or straining-nothing over 10 pounds for 2 weeks.  a. You may drive when you are no longer taking prescription pain medication, you can comfortably wear a seatbelt, and you can safely maneuver your car and apply brakes. b. RETURN TO WORK:  Desk work in one week, full duty in 2 if pain-free__________________________________________________________ 9. You should see your doctor in the office for a follow-up appointment approximately 2-3 weeks after your surgery.  Make sure that you call for this appointment within a day or two after you arrive home to insure a convenient appointment time. 10. OTHER INSTRUCTIONS: __________________________________________________________________________________________________________________________ __________________________________________________________________________________________________________________________ WHEN TO CALL YOUR DOCTOR: 1. Fever over 101.0 2. Inability to urinate 3. Continued bleeding from incision. 4. Increased pain, redness, or drainage from the incision. 5. Increasing abdominal pain  The clinic staff is available to answer your questions during regular business hours.  Please dont hesitate to call and ask to speak to one of the nurses for clinical concerns.  If you have a medical emergency, go to the nearest emergency room or call 911.  A surgeon from [facility] is always on call at the hospital. [address], [place], [place]  [address] ? [address] [address], [place], [place]  [address] [phone] ? [phone] ? FAX [phone] Web site: [email or web address]

## 2015-04-09 NOTE — Anesthesia Preprocedure Evaluation (Addendum)
Anesthesia Evaluation  Patient identified by MRN, date of birth, ID band Patient awake    Reviewed: Allergy & Precautions, NPO status , Patient's Chart, lab work & pertinent test results  History of Anesthesia Complications (+) history of anesthetic complications  Airway Mallampati: II  TM Distance: >3 FB Neck ROM: Full    Dental no notable dental hx.    Pulmonary neg pulmonary ROS,  breath sounds clear to auscultation  Pulmonary exam normal       Cardiovascular Exercise Tolerance: Good hypertension, Pt. on medications Normal cardiovascular examRhythm:Regular Rate:Normal     Neuro/Psych Seizures -,  negative psych ROS   GI/Hepatic Neg liver ROS, GERD-  Medicated,  Endo/Other  negative endocrine ROS  Renal/GU negative Renal ROS  negative genitourinary   Musculoskeletal  (+) Arthritis -,   Abdominal (+) + obese,   Peds negative pediatric ROS (+)  Hematology negative hematology ROS (+)   Anesthesia Other Findings   Reproductive/Obstetrics negative OB ROS                            Anesthesia Physical Anesthesia Plan  ASA: II  Anesthesia Plan: General   Post-op Pain Management:    Induction: Intravenous  Airway Management Planned: Oral ETT  Additional Equipment:   Intra-op Plan:   Post-operative Plan: Extubation in OR  Informed Consent: I have reviewed the patients History and Physical, chart, labs and discussed the procedure including the risks, benefits and alternatives for the proposed anesthesia with the patient or authorized representative who has indicated his/her understanding and acceptance.   Dental advisory given  Plan Discussed with: CRNA  Anesthesia Plan Comments:         Anesthesia Quick Evaluation

## 2015-04-09 NOTE — H&P (View-Only) (Signed)
Terri [name] 03/19/2015 4:44 PM Location: [facility] Patient #: [id number] DOB: 20-Jul-1951 Single / Language: [place] / Race: White Female History of Present Illness Adolph Pollack MD; 03/19/2015 5:30 PM) The patient is a 64 year old female    Note:She is here for another follow-up visit following percutaneous cholecystostomy tube placement for acute cholecystitis February 11, 2015. Yesterday, her mother fell and she was helping her up when she got some sharp pain around the drain site. It is a little better today. Drain output is unchanged. No fever.  Allergies Fay Records, [place]; 03/19/2015 4:44 PM) No Known Drug Allergies 02/27/2015  Medication History Fay Records, [place]; 03/19/2015 4:47 PM) TraMADol HCl (50MG  Tablet, Oral) Active. Amoxicillin-Pot Clavulanate (875-125MG  Tablet, Oral) Active. PredniSONE (10MG  Tablet, Oral) Active. Pantoprazole Sodium (40MG  Tablet DR, Oral) Active. Benicar HCT (20-12.5MG  Tablet, Oral) Active. Benzonatate (200MG  Capsule, Oral) Active. Esomeprazole Magnesium (40MG  Capsule DR, Oral) Active. Naproxen Sodium (220MG  Capsule, Oral) Active. Aspirin (81MG  Tablet, Oral) Active. Florastor (250MG  Capsule, Oral) Active. Nitroglycerin (0.4MG  Tab Sublingual, Sublingual) Active. Famotidine (20MG  Tablet, Oral) Active. Cholecalciferol (1000UNIT Capsule, Oral) Active. Medications Reconciled     Review of Systems Adolph Pollack MD; 03/19/2015 5:30 PM)  Note: Positive for a chronic cough.   Vitals Fay Records CMA; 03/19/2015 4:48 PM) 03/19/2015 4:47 PM Weight: 219 lb Height: 64in Body Surface Area: 2.12 m Body Mass Index: 37.59 kg/m Temp.: 98.48F(Oral)  Pulse: 88 (Regular)  Resp.: 17 (Unlabored)  BP: 124/70 (Sitting, Left Arm, Standard)     Physical Exam Adolph Pollack MD; 03/19/2015 5:31 PM)  The physical exam findings are as follows: Note:General-obese female in no acute distress.  Eyes-no  icterus.  Cardiovascular-regular rate and rhythm.  Abdomen-soft, no tenderness, drain in right upper quadrant with Golden liquid output.  Back-no CVA tenderness.    Assessment & Plan Adolph Pollack MD; 03/19/2015 5:32 PM)  ACUTE CHOLECYSTITIS (575.0  K81.0) Impression: Status post percutaneous cholecystostomy treatment. She is now ready to proceed with laparoscopic possible open cholecystectomy. She's had multiple previous abdominal surgeries.  Plan: Schedule laparoscopic possible open cholecystectomy. I have explained the procedure, risks, and aftercare of cholecystectomy. Risks include but are not limited to bleeding, infection, wound problems, anesthesia, diarrhea, bile leak, injury to common bile duct/liver/intestine. She seems to understand and agrees to proceed.  Current Plans Started TraMADol HCl 50MG , 1 (one) Tablet every six hours, as needed, #30, 03/19/2015, No Refill. Schedule for Surgery  Avel Peace, MD

## 2015-04-09 NOTE — Transfer of Care (Signed)
Immediate Anesthesia Transfer of Care Note  Patient: Terri [name]  Procedure(s) Performed: Procedure(s): LAPAROSCOPIC CHOLECYSTECTOMY  (N/A)  Patient Location: PACU  Anesthesia Type:General  Level of Consciousness:  sedated, patient cooperative and responds to stimulation  Airway & Oxygen Therapy:Patient Spontanous Breathing and Patient connected to face mask oxgen  Post-op Assessment:  Report given to PACU RN and Post -op Vital signs reviewed and stable  Post vital signs:  Reviewed and stable  Last Vitals:  Filed Vitals:   04/09/15 0814  BP: 117/62  Pulse: 78  Temp: 36.6 C  Resp: 18    Complications: No apparent anesthesia complications

## 2015-04-09 NOTE — Anesthesia Procedure Notes (Signed)
Procedure Name: Intubation Date/Time: 04/09/2015 10:40 AM Performed by: Early Osmond E Pre-anesthesia Checklist: Patient identified, Emergency Drugs available, Suction available and Patient being monitored Patient Re-evaluated:Patient Re-evaluated prior to inductionOxygen Delivery Method: Circle System Utilized Preoxygenation: Pre-oxygenation with 100% oxygen Intubation Type: IV induction Ventilation: Mask ventilation without difficulty Laryngoscope Size: Miller and 2 Grade View: Grade I Tube type: Oral Tube size: 6.5 mm Number of attempts: 1 Airway Equipment and Method: Stylet Placement Confirmation: ETT inserted through vocal cords under direct vision,  positive ETCO2 and breath sounds checked- equal and bilateral Secured at: 21 cm Tube secured with: Tape Dental Injury: Teeth and Oropharynx as per pre-operative assessment

## 2015-04-09 NOTE — Interval H&P Note (Signed)
History and Physical Interval Note:  04/09/2015 9:57 AM  Terri [name]  has presented today for surgery, with the diagnosis of CHOLECYSTITIS,CHOLELITHIASIS  The various methods of treatment have been discussed with the patient and family. After consideration of risks, benefits and other options for treatment, the patient has consented to  Procedure(s): LAPAROSCOPIC CHOLECYSTECTOMY WITH INTRAOPERATIVE CHOLANGIOGRAM (N/A) as a surgical intervention .  The patient's history has been reviewed, patient examined, no change in status, stable for surgery.  I have reviewed the patient's chart and labs.  Questions were answered to the patient's satisfaction.     Jaylaa Gallion Shela Commons

## 2015-04-09 NOTE — Op Note (Signed)
Preoperative diagnosis:  Acute calculus cholecystitis status post percutaneous drain placement  Postoperative diagnosis:  Same  Procedure: Laparoscopic cholecystectomy with attempted cholangiogram.  Surgeon: Avel Peace, M.D.  Asst.:  Marca Ancona M.D.  Anesthesia: General  Indication:   This is a 64 year old female who presented a 2 months ago with acute calculus cholecystitis and was treated with antibiotics and percutaneous drainage. She has done well and now presents for cholecystectomy.  Technique: She was brought to the operating room, placed supine on the operating table, and a general anesthetic was administered. The abdominal wall and drain were then sterilely prepped and draped. She has a lower midline incision from her previous total abdominal colectomy.  Local anesthetic (Marcaine) was infiltrated in the supraumbilical region. A small supraumbilical incision was made through the skin, subcutaneous tissue, fascia, and peritoneum entering the peritoneal cavity under direct vision. A pursestring suture of 0 Vicryl was placed around the edges of the fascia. A Hassan trocar was introduced into the peritoneal cavity and a pneumoperitoneum was created by insufflation of carbon dioxide gas. The laparoscope was introduced into the trocar and no underlying bleeding or organ injury was noted.  Adhesions between the omentum and liver and omentum and right upper quadrant were noted. She was then placed in the reverse Trendelenburg position with the right side tilted slightly up.  A 5 mm trocar was then placed  in the epigastric area Indications in the right upper quadrant and the pelvic area were lysed sharply and also separated bluntly.  Next, 2 trocars were then placed in the right upper quadrant area. The gallbladder was visualized and the fundus was grasped and retracted toward the right shoulder.  The infundibulum was mobilized with dissection close to the gallbladder and retracted laterally.  The cystic duct was identified and a window was created around it. The anterior branch cystic artery was also identified and a window was created around it. It was clipped and divided. The critical view was achieved. A clip was placed at the neck of the gallbladder. A small incision was made in the cystic duct. A cholangiocatheter was introduced through the anterior abdominal wall.  I attempted to thread it into the cystic duct without success. Given the fact that her liver function tests were normal, and the anatomy was clear, I chose to not proceed with the cholangiogram.  The cystic duct was clipped 3 times on the biliary side, and then the cystic duct was divided sharply. No bile leak was noted from the cystic duct stump.  A posterior branch of the cystic artery was identified. It was clipped and divided. Following this the gallbladder was dissected free from the liver using electrocautery.  The percutaneous cholecystostomy tube was removed. The gallbladder was then placed in a retrieval bag and removed from the abdominal cavity through the subumbilical incision.  The gallbladder fossa was inspected, and irrigated.  Bleeding from a vein in the liver bed was controlled with electrocautery and Surgicel. Further inspection demonstrated no evidence of bleeding or bile leak.  The irrigation fluid was evacuated as much as possible.  The supraumbilical trocar was removed and the fascial defect was closed by tightening and tying down the pursestring suture under laparoscopic vision.  The remaining trocars were removed and the pneumoperitoneum was released. The skin incisions were closed with 4-0 Monocryl subcuticular stitches. Steri-Strips and sterile dressings were applied.  A sterile dressing was applied to the percutaneous drain site.  The procedure was well-tolerated without any apparent complications. She was  taken to the recovery room in satisfactory condition.

## 2015-04-09 NOTE — Anesthesia Postprocedure Evaluation (Signed)
  Anesthesia Post-op Note  Patient: Terri [name]  Procedure(s) Performed: Procedure(s) (LRB): LAPAROSCOPIC CHOLECYSTECTOMY  (N/A)  Patient Location: PACU  Anesthesia Type: General  Level of Consciousness: awake and alert   Airway and Oxygen Therapy: Patient Spontanous Breathing  Post-op Pain: mild  Post-op Assessment: Post-op Vital signs reviewed, Patient's Cardiovascular Status Stable, Respiratory Function Stable, Patent Airway and No signs of Nausea or vomiting  Last Vitals:  Filed Vitals:   04/09/15 1330  BP: 109/63  Pulse: 75  Temp: 36.6 C  Resp: 12    Post-op Vital Signs: stable   Complications: No apparent anesthesia complications

## 2015-04-10 DIAGNOSIS — K8012 Calculus of gallbladder with acute and chronic cholecystitis without obstruction: Secondary | ICD-10-CM | POA: Diagnosis not present

## 2015-04-10 MED ORDER — OXYCODONE HCL 5 MG PO TABS: 5.0000 mg | ORAL_TABLET | ORAL | Status: DC | PRN

## 2015-04-10 NOTE — Progress Notes (Signed)
1 Day Post-Op  Subjective: Sore when walking.  Tolerating diet.  Objective: Vital signs in last 24 hours: Temp:  [97.7 F (36.5 C)-98.9 F (37.2 C)] 98.1 F (36.7 C) (06/24 0558) Pulse Rate:  [67-98] 91 (06/24 0558) Resp:  [8-18] 18 (06/24 0558) BP: (97-114)/(49-64) 100/55 mmHg (06/24 0558) SpO2:  [94 %-100 %] 96 % (06/24 0558) Last BM Date: 04/08/15  Intake/Output from previous day: 06/23 0701 - 06/24 0700 In: 3992.9 [P.O.:960; I.V.:2882.9; IV Piggyback:150] Out: 1650 [Urine:1650] Intake/Output this shift:    PE: General- In NAD Abdomen-soft, dressings dry  Lab Results:   Recent Labs  04/08/15 0920  WBC 6.6  HGB 12.3  HCT 38.5  PLT 216   BMET  Recent Labs  04/08/15 0920  NA 139  K 5.0  CL 101  CO2 29  GLUCOSE 109*  BUN 16  CREATININE 0.91  CALCIUM 9.9   PT/INR  Recent Labs  04/08/15 0920  LABPROT 14.3  INR 1.09   Comprehensive Metabolic Panel:    Component Value Date/Time   NA 139 04/08/2015 0920   NA 140 02/12/2015 0506   K 5.0 04/08/2015 0920   K 4.1 02/12/2015 0506   CL 101 04/08/2015 0920   CL 107 02/12/2015 0506   CO2 29 04/08/2015 0920   CO2 25 02/12/2015 0506   BUN 16 04/08/2015 0920   BUN 14 02/12/2015 0506   CREATININE 0.91 04/08/2015 0920   CREATININE 0.77 02/12/2015 0506   GLUCOSE 109* 04/08/2015 0920   GLUCOSE 109* 02/12/2015 0506   CALCIUM 9.9 04/08/2015 0920   CALCIUM 8.7 02/12/2015 0506   AST 31 04/08/2015 0920   AST 31 02/12/2015 0506   ALT 25 04/08/2015 0920   ALT 31 02/12/2015 0506   ALKPHOS 83 04/08/2015 0920   ALKPHOS 71 02/12/2015 0506   BILITOT 0.9 04/08/2015 0920   BILITOT 0.7 02/12/2015 0506   PROT 7.6 04/08/2015 0920   PROT 6.4 02/12/2015 0506   ALBUMIN 4.3 04/08/2015 0920   ALBUMIN 3.3* 02/12/2015 0506     Studies/Results: Dg C-arm 1-60 Min-no Report  04/09/2015   CLINICAL DATA: Chronic cholecystitis with calculus   C-ARM 1-60 MINUTES  Fluoroscopy was utilized by the requesting physician.  No  radiographic  interpretation.     Anti-infectives: Anti-infectives    Start     Dose/Rate Route Frequency Ordered Stop   04/09/15 1700  ceFAZolin (ANCEF) IVPB 1 g/50 mL premix     1 g 100 mL/hr over 30 Minutes Intravenous Every 6 hours 04/09/15 1357 04/10/15 0435   04/09/15 0818  ceFAZolin (ANCEF) IVPB 2 g/50 mL premix     2 g 100 mL/hr over 30 Minutes Intravenous On call to O.R. 04/09/15 0818 04/09/15 1102      Assessment Active Problems:   Chronic cholecystitis with calculus s/p lap chole-doing well      Plan: Discharge.  Instructions given to her.   Erroll Wilbourne Shela Commons 04/10/2015

## 2015-04-10 NOTE — Progress Notes (Signed)
Pt vitals are stable with no complaints of pain. Incision sites are WNL with no signs and symptoms of infection. Discussed discharge instructions with patient and she had no questions or comments. Discharged to home.

## 2015-04-10 NOTE — Discharge Summary (Signed)
Physician Discharge Summary  Patient ID: Terri [name] MRN: [id number] DOB/AGE: 1951/08/20 64 y.o.  Admit date: 04/09/2015 Discharge date: 04/10/2015  Admission Diagnoses:  Chronic calculous cholecystitis  Discharge Diagnoses:  Active Problems:   Chronic cholecystitis with calculus   Discharged Condition: good  Hospital Course: She underwent a laparoscopic cholecystectomy on 6/23 and tolerated this well.  She was ready for discharge on 6/24.  Discharge instructions were given to her.  Discharge Exam: Blood pressure 100/55, pulse 91, temperature 98.1 F (36.7 C), temperature source Oral, resp. rate 18, height 5\' 4"  (1.626 m), weight 97.977 kg (216 lb), SpO2 96 %.   Disposition: 06-Home-Health Care Svc     Medication List    STOP taking these medications        pantoprazole 40 MG tablet  Commonly known as:  PROTONIX      TAKE these medications        aspirin 81 MG tablet  Take 81 mg by mouth at bedtime.     benzonatate 200 MG capsule  Commonly known as:  TESSALON  One every 4 hours as needed     famotidine 20 MG tablet  Commonly known as:  PEPCID  One at bedtime     naproxen sodium 220 MG tablet  Commonly known as:  ANAPROX  Take 440 mg by mouth 2 (two) times daily as needed (pain).     NEXIUM 24HR 20 MG Tbec  Generic drug:  Esomeprazole Magnesium  Take 1 capsule by mouth daily.     nitroGLYCERIN 0.4 MG SL tablet  Commonly known as:  NITROSTAT  Place 0.4 mg under the tongue every 5 (five) minutes as needed for chest pain.     olmesartan-hydrochlorothiazide 20-12.5 MG per tablet  Commonly known as:  BENICAR HCT  Take 1 tablet by mouth daily.     predniSONE 10 MG tablet  Commonly known as:  DELTASONE  Take  4 each am x 2 days,   2 each am x 2 days,  1 each am x 2 days and stop     saccharomyces boulardii 250 MG capsule  Commonly known as:  FLORASTOR  Take 1 capsule (250 mg total) by mouth 2 (two) times daily.     traMADol 50 MG tablet  Commonly known  as:  ULTRAM  1-2 every 4 hours as needed for cough or pain         Signed: Hardeep Reetz J 04/10/2015, 8:50 AM

## 2015-04-13 ENCOUNTER — Encounter (HOSPITAL_COMMUNITY): Payer: Self-pay | Admitting: General Surgery

## 2015-09-22 ENCOUNTER — Telehealth: Payer: Self-pay | Admitting: Internal Medicine

## 2015-09-22 NOTE — Telephone Encounter (Signed)
[facility] On 09/21/2015 I faxed 26 pages and received confirmation from fax that all 26 pages were received on 09/21/2015

## 2015-10-01 ENCOUNTER — Encounter: Payer: Self-pay | Admitting: Neurology

## 2015-10-01 ENCOUNTER — Ambulatory Visit (INDEPENDENT_AMBULATORY_CARE_PROVIDER_SITE_OTHER): Payer: PRIVATE HEALTH INSURANCE | Admitting: Neurology

## 2015-10-01 VITALS — BP 128/80 | HR 72 | Resp 16 | Ht 64.0 in | Wt 231.0 lb

## 2015-10-01 DIAGNOSIS — G4719 Other hypersomnia: Secondary | ICD-10-CM | POA: Diagnosis not present

## 2015-10-01 DIAGNOSIS — G4761 Periodic limb movement disorder: Secondary | ICD-10-CM | POA: Diagnosis not present

## 2015-10-01 DIAGNOSIS — R519 Headache, unspecified: Secondary | ICD-10-CM

## 2015-10-01 DIAGNOSIS — R51 Headache: Secondary | ICD-10-CM

## 2015-10-01 DIAGNOSIS — R0683 Snoring: Secondary | ICD-10-CM

## 2015-10-01 DIAGNOSIS — G2581 Restless legs syndrome: Secondary | ICD-10-CM

## 2015-10-01 NOTE — Patient Instructions (Addendum)
Based on your symptoms and your exam I believe you are at risk for obstructive sleep apnea or OSA, and I think we should proceed with a sleep study to determine whether you do or do not have OSA and how severe it is. If you have more than mild OSA, I want you to consider treatment with CPAP. Please remember, the risks and ramifications of moderate to severe obstructive sleep apnea or OSA are: Cardiovascular disease, including congestive heart failure, stroke, difficult to control hypertension, arrhythmias, and even type 2 diabetes has been linked to untreated OSA. Sleep apnea causes disruption of sleep and sleep deprivation in most cases, which, in turn, can cause recurrent headaches, problems with memory, mood, concentration, focus, and vigilance. Most people with untreated sleep apnea report excessive daytime sleepiness, which can affect their ability to drive. Please do not drive if you feel sleepy.   I will likely see you back after your sleep study to go over the test results and where to go from there. We will call you after your sleep study to advise about the results (most likely, you will hear from Lafonda Mossesiana, my nurse) and to set up an appointment at the time, as necessary.    Our sleep lab administrative assistant, Alvis LemmingsDawn will meet with you or call you to schedule your sleep study. If you don't hear back from her by next week please feel free to call her at [phone]. This is her direct line and please leave a message with your phone number to call back if you get the voicemail box. She will call back as soon as possible.   Talk to Dr. Link SnufferHolwerda about changing your antidepressant from Wellbutrin.   Please remember, common seizure triggers are: Sleep deprivation, dehydration, overheating, stress, hypoglycemia or skipping meals, certain medications or excessive alcohol use, especially stopping alcohol abruptly if you have had heavy alcohol use before (aka alcohol withdrawal seizure). If you have a  prolonged seizure over 2-5 minutes or back to back seizures, call or have someone call 911 or take you to the nearest emergency room. You cannot drive a car or operate any other machinery or vehicle within 6 months of a seizure. Please do not swim alone or take a tub bath for safety. Do not cook with large quantities of boiling water or oil for safety. Take your medicine for seizure prevention regularly and do not skip doses or stop medication abruptly and tone are told to do so by your healthcare provider. Try to get a refill on your antiepileptic medication ahead of time, so you are not at risk of running out. If you run out of the seizure medication and do not have a refill at hand she may run into medication withdrawal seizures. Avoid taking Wellbutrin, narcotic pain medications and tramadol, as they can lower seizure threshold.
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
## 2015-10-14 ENCOUNTER — Ambulatory Visit (INDEPENDENT_AMBULATORY_CARE_PROVIDER_SITE_OTHER): Payer: PRIVATE HEALTH INSURANCE | Admitting: Neurology

## 2015-10-14 DIAGNOSIS — G4733 Obstructive sleep apnea (adult) (pediatric): Secondary | ICD-10-CM | POA: Diagnosis not present

## 2015-10-14 DIAGNOSIS — G4761 Periodic limb movement disorder: Secondary | ICD-10-CM

## 2015-10-15 ENCOUNTER — Telehealth: Payer: Self-pay | Admitting: Neurology

## 2015-10-15 DIAGNOSIS — G4733 Obstructive sleep apnea (adult) (pediatric): Secondary | ICD-10-CM

## 2015-10-15 NOTE — Telephone Encounter (Signed)
Terri [name]:  Patient referred by Dr. Link SnufferHolwerda, seen by me on 10/01/15, split study on 10/14/15, Ins: [employer]. Please call and notify patient that the recent sleep study confirmed the diagnosis of severe OSA. She did very well with CPAP during the study with significant improvement of the respiratory events. Therefore, I would like start the patient on CPAP therapy at home by prescribing a machine for home use. I placed the order in the chart. The patient will need a follow up appointment with me in 8 to 10 weeks post set up that has to be scheduled; please go ahead and schedule while you have the patient on the phone and make sure patient understands the importance of keeping this window for the FU appointment, as it is often an insurance requirement and failing to adhere to this may result in losing coverage for sleep apnea treatment.  Please re-enforce the importance of compliance with treatment and the need for us to monitor compliance data - again an insurance requirement and good feedback for the patient as far as how they are doing.  Also remind patient, that any upcoming CPAP machine or mask issues, should be first addressed with the DME company. Please ask if patient has a preference regarding DME company.  Please arrange for CPAP set up at home through a DME company of patient's choice - once you have spoken to the patient - and faxed/routed report to PCP and referring MD (if other than PCP), you can close this encounter, thanks,   Terri FoleySaima Xandria Gallaga, MD, PhD [facility] ([facility])

## 2015-10-15 NOTE — Sleep Study (Signed)
Please see the scanned sleep study interpretation located in the procedure tab within the chart review section.   

## 2015-10-21 NOTE — Telephone Encounter (Signed)
I spoke to patient and she is aware of results and recommendations. I will refer to [facility]. Patient has new insurance and has faxed me the copy ([employer]). I will send a copy of report to PCP. I will also send the patient a letter reminding her to make f/u appt and stress the importance of compliance.

## 2016-07-25 ENCOUNTER — Ambulatory Visit (INDEPENDENT_AMBULATORY_CARE_PROVIDER_SITE_OTHER): Payer: BLUE CROSS/BLUE SHIELD | Admitting: Orthopedic Surgery

## 2016-07-25 DIAGNOSIS — M25512 Pain in left shoulder: Secondary | ICD-10-CM | POA: Diagnosis not present

## 2016-07-26 ENCOUNTER — Other Ambulatory Visit (INDEPENDENT_AMBULATORY_CARE_PROVIDER_SITE_OTHER): Payer: Self-pay | Admitting: Orthopedic Surgery

## 2016-07-26 DIAGNOSIS — M25512 Pain in left shoulder: Secondary | ICD-10-CM

## 2016-08-09 ENCOUNTER — Other Ambulatory Visit: Payer: PRIVATE HEALTH INSURANCE

## 2016-08-15 ENCOUNTER — Ambulatory Visit (INDEPENDENT_AMBULATORY_CARE_PROVIDER_SITE_OTHER): Payer: BLUE CROSS/BLUE SHIELD | Admitting: Orthopedic Surgery

## 2016-09-13 ENCOUNTER — Ambulatory Visit
Admission: RE | Admit: 2016-09-13 | Discharge: 2016-09-13 | Disposition: A | Discharge status: Home / Self Care | Payer: BLUE CROSS/BLUE SHIELD | Source: Ambulatory Visit | Attending: Orthopedic Surgery | Admitting: Orthopedic Surgery

## 2016-09-13 DIAGNOSIS — M25512 Pain in left shoulder: Secondary | ICD-10-CM

## 2016-09-13 MED ORDER — IOPAMIDOL (ISOVUE-M 200) INJECTION 41%: 15.0000 mL | Freq: Once | INTRAMUSCULAR | Status: DC

## 2016-09-21 ENCOUNTER — Ambulatory Visit (INDEPENDENT_AMBULATORY_CARE_PROVIDER_SITE_OTHER): Payer: BLUE CROSS/BLUE SHIELD | Admitting: Orthopedic Surgery

## 2016-09-21 ENCOUNTER — Encounter (INDEPENDENT_AMBULATORY_CARE_PROVIDER_SITE_OTHER): Payer: Self-pay | Admitting: Orthopedic Surgery

## 2016-09-21 VITALS — Ht 63.0 in | Wt 235.0 lb

## 2016-09-21 DIAGNOSIS — M25512 Pain in left shoulder: Secondary | ICD-10-CM | POA: Diagnosis not present

## 2016-09-21 DIAGNOSIS — M75122 Complete rotator cuff tear or rupture of left shoulder, not specified as traumatic: Secondary | ICD-10-CM | POA: Diagnosis not present

## 2016-09-21 MED ORDER — ACETAMINOPHEN-CODEINE #3 300-30 MG PO TABS: 1.0000 | ORAL_TABLET | Freq: Four times a day (QID) | ORAL | 0 refills | Status: AC | PRN

## 2016-09-22 NOTE — Progress Notes (Signed)
Office Visit Note   Patient: Terri [name]           Date of Birth: Feb 21, 1951           MRN: [id number] Visit Date: 09/21/2016 Requested by: Alysia PennaScott Holwerda, MD [address] [place], [place] [address] PCP: Alysia PennaHOLWERDA, SCOTT, MD  Subjective: Chief Complaint  Patient presents with  . Left Shoulder - Pain, Follow-up    HPI Terri [name] is a 65 year old female here for review of left shoulder MRI scan.  She states her symptoms are getting worse.  It is difficult for her to get dressed.  She states the Tylenol No. 3 does help.  The pain is worse at night.  Injection helped her but it did not last.  MRI scan is reviewed and does show small leading edge supraspinatus rotator cuff tear              Review of Systems All systems reviewed are negative as they relate to the chief complaint within the history of present illness.  Patient denies  fevers or chills.    Assessment & Plan: Visit Diagnoses:  1. Left shoulder pain, unspecified chronicity   2. Complete rotator cuff tear of left shoulder     Plan: Impression is left shoulder rotator cuff tear which is symptomatic for the patient but not symptomatic enough for surgery.  I'm going to refill her Tylenol 3 and start her in physical therapy.  Two-month return for clinical recheck and repeat assessment as to the need for surgery.  I think this tear is small enough that we have some time to wait before becomes retracted to the point where it is not repairable.  Follow-Up Instructions: Return in about 8 weeks (around 11/16/2016).   Orders:  No orders of the defined types were placed in this encounter.  Meds ordered this encounter  Medications  . acetaminophen-codeine (TYLENOL #3) 300-30 MG tablet    Sig: Take 1 tablet by mouth every 6 (six) hours as needed for moderate pain.    Dispense:  30 tablet    Refill:  0      Procedures: No procedures performed   Clinical Data: No additional findings.  Objective: Vital Signs: Ht 5\' 3"  (1.6 m)    Wt 235 lb (106.6 kg)   BMI 41.63 kg/m   Physical Exam   Constitutional: Patient appears well-developed HEENT:  Head: Normocephalic Eyes:EOM are normal Neck: Normal range of motion Cardiovascular: Normal rate Pulmonary/chest: Effort normal Neurologic: Patient is alert Skin: Skin is warm Psychiatric: Patient has normal mood and affect   Ortho Exam examination of the left shoulder demonstrates fairly symmetric range of motion and rotator cuff strength.  O'Brien's testing negative.  Not much in the way of course grinding or crepitus is present with passive range of motion today.  No before meals joint tenderness to direct palpation.  Neck range of motion is full.  Motor sensory function to the left arm is intact.  Specialty Comments:  No specialty comments available.  Imaging: No results found.   PMFS History: Patient Active Problem List   Diagnosis Date Noted  . Chronic cholecystitis with calculus 04/09/2015  . Cholelithiasis and acute cholecystitis without obstruction   . Acute cholecystitis 02/11/2015  . Essential hypertension 07/26/2014  . Upper airway cough syndrome 07/25/2014   Past Medical History:  Diagnosis Date  . Anxiety   . Arthritis   . Arthritis   . Complication of anesthesia    STAYS SLEEPY FROM ANESTHESIA  .  Depression   . GERD (gastroesophageal reflux disease)   . Hypertension   . Major depressive disorder, recurrent, mild (HCC)   . Seizure (HCC)   . Ulcerative colitis (HCC)     Family History  Problem Relation Age of Onset  . Breast cancer Sister   . Rheum arthritis Sister   . Kidney failure Father   . Heart attack Father     Past Surgical History:  Procedure Laterality Date  . APPENDECTOMY    . CHOLECYSTECTOMY N/A 04/09/2015   Procedure: LAPAROSCOPIC CHOLECYSTECTOMY ;  Surgeon: Avel Peaceodd Rosenbower, MD;  Location: [facility];  Service: General;  Laterality: N/A;  . COLON SURGERY    . rod placed in right let (tibia) 30 yrs ago    . rod removed  from right leg 10 yrs ago    . TONSILLECTOMY     as child   Social History   Occupational History  . Office Admin     [employer]   Social History Main Topics  . Smoking status: Never Smoker  . Smokeless tobacco: Never Used  . Alcohol use 0.0 oz/week     Comment: Rare  . Drug use: No  . Sexual activity: No

## 2016-11-16 ENCOUNTER — Ambulatory Visit (INDEPENDENT_AMBULATORY_CARE_PROVIDER_SITE_OTHER): Payer: BLUE CROSS/BLUE SHIELD | Admitting: Orthopedic Surgery

## 2018-04-17 IMAGING — XA DG FLUORO GUIDE NDL PLC/BX
3 series · 3 of 3 positions shown · non-contrast
Comparison: none

CLINICAL DATA: Left shoulder pain.  Biceps pain.

[Series 1: ortho standard · 1 of 1 slices shown (1 of 3)]
[im 1/1]
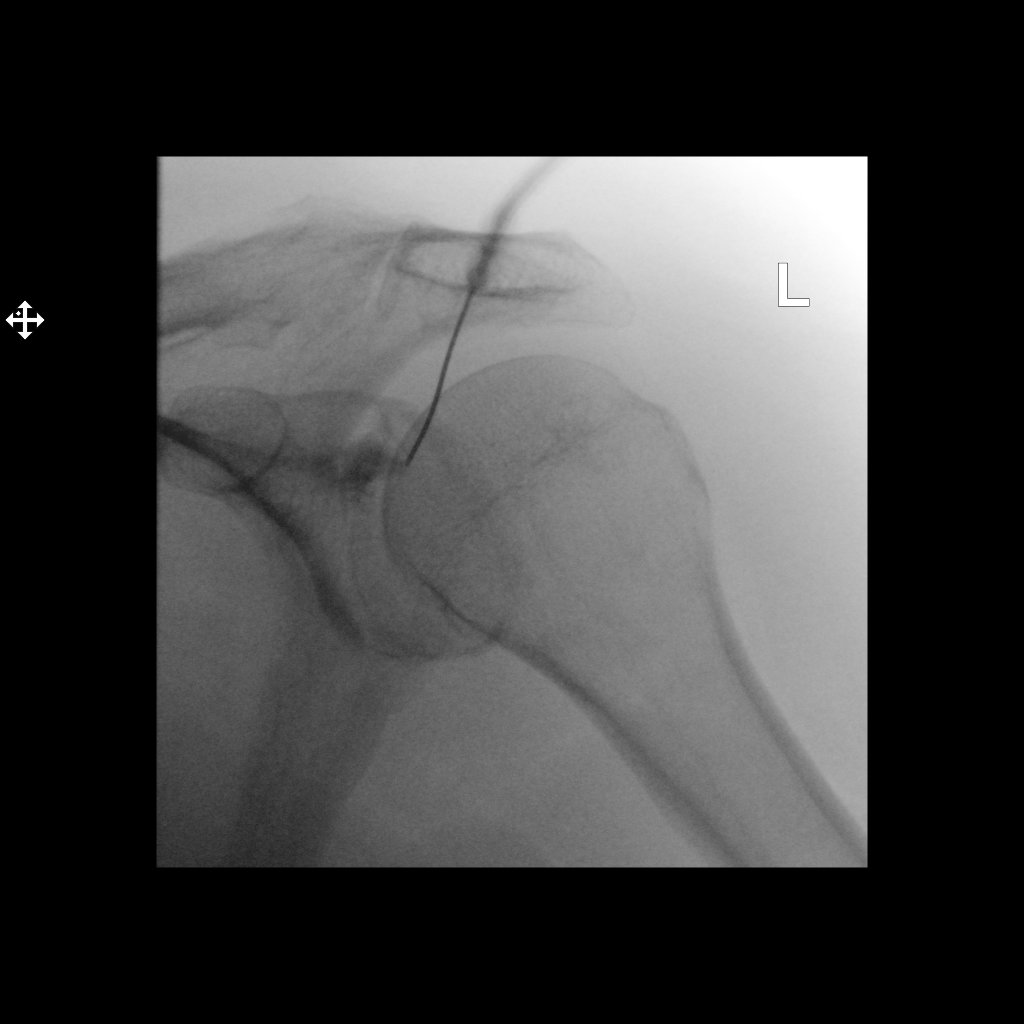

[Series 2: ortho standard · 1 of 1 slices shown (2 of 3)]
[im 1/1]
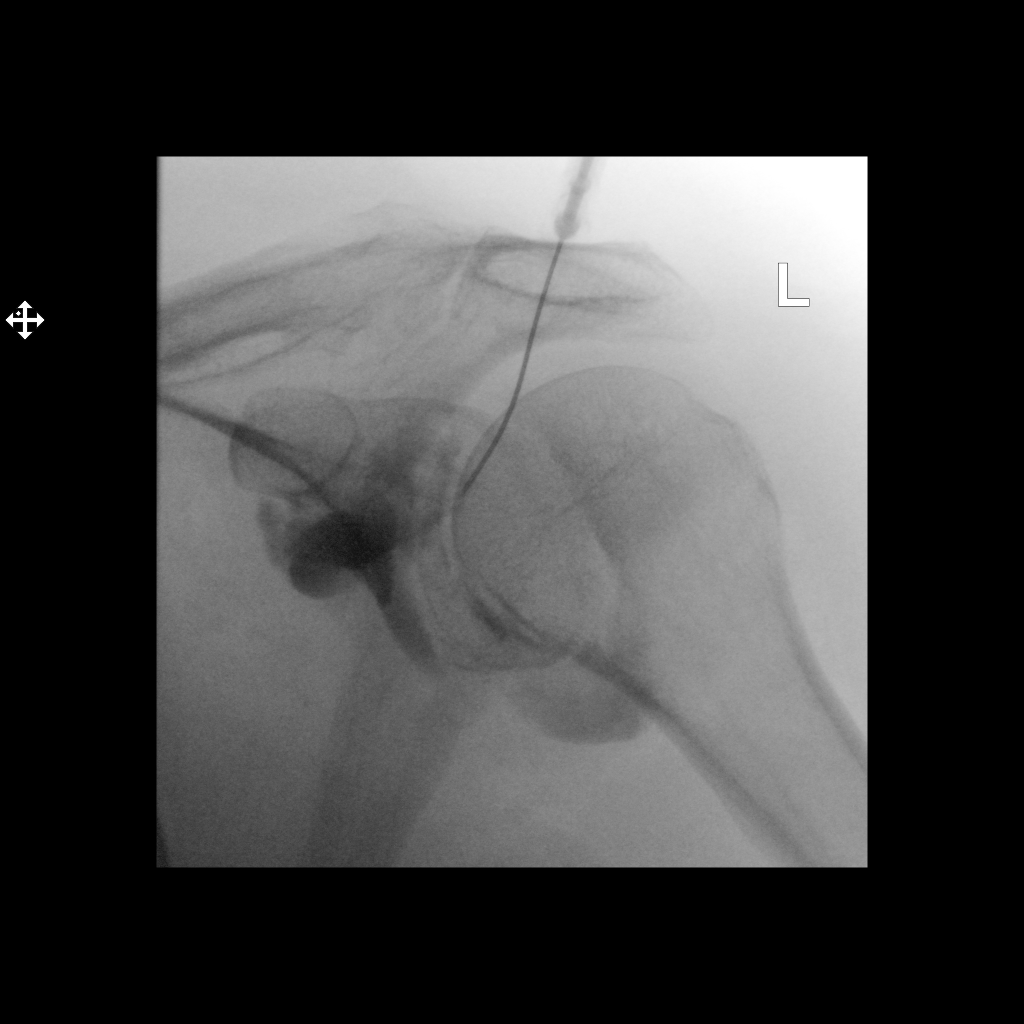

[Series 3: ortho standard · 1 of 1 slices shown (3 of 3)]
[im 1/1]
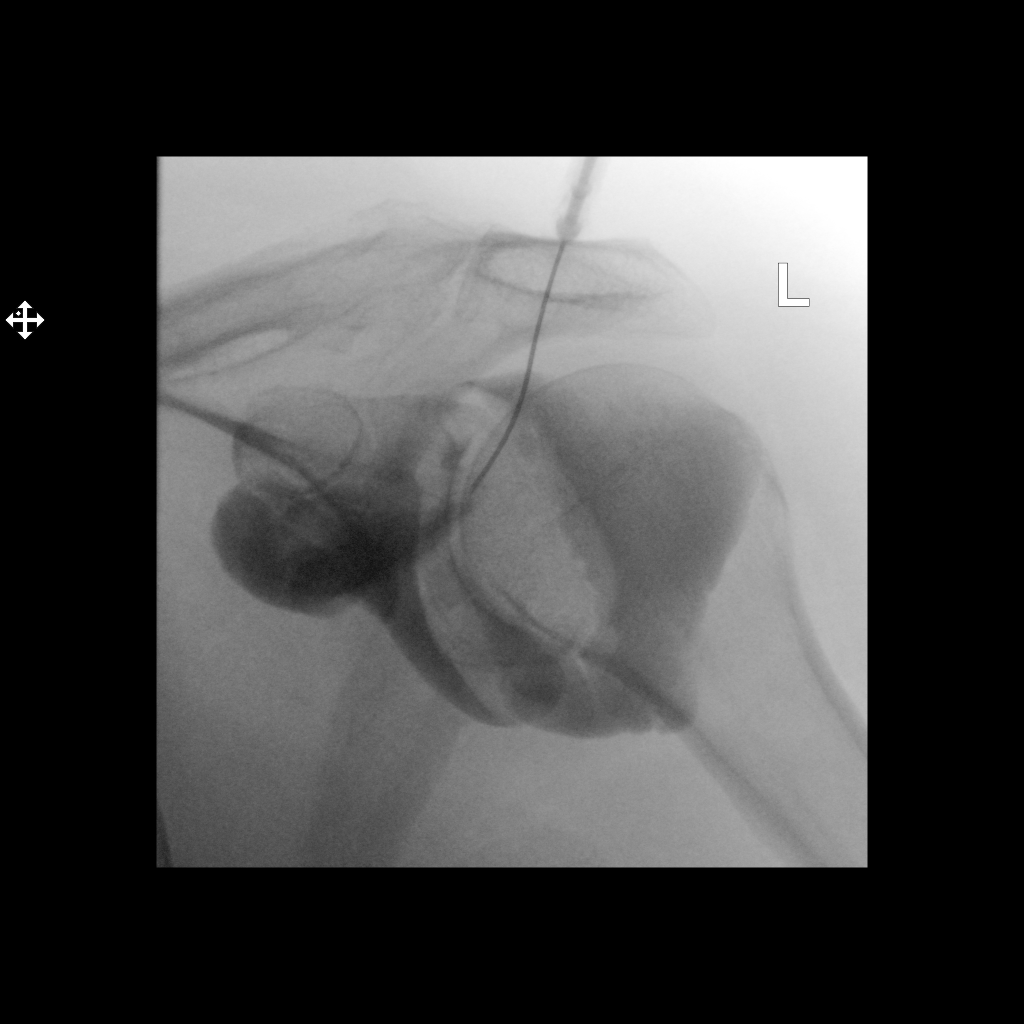

[3 of 3 positions shown; findings below may reference images not displayed]

FLUOROSCOPY TIME:  Radiation Exposure Index (as provided by the
fluoroscopic device): 37.76 uGy*m2

Fluoroscopy Time:  33 seconds

Number of Acquired Images:  0

PROCEDURE:
Left SHOULDER INJECTION UNDER FLUOROSCOPY

An appropriate skin entrance site was determined. The site was
marked, prepped with Betadine, draped in the usual sterile fashion,
and infiltrated locally with Lidocaine. 22 gauge spinal needle was
advanced to the superomedial margin of the humeral head under
intermittent fluoroscopy. 1 ml of Lidocaine injected easily. A
mixture of 0.1 ml Multihance and 20 ml of dilute Isovue-M 200 was
then used to opacify the left shoulder capsule. No immediate
complication.
IMPRESSION: Technically successful left shoulder injection for MRI.
